# Patient Record
Sex: Female | Born: 1965
Health system: Southern US, Community
[De-identification: ages and names within clinical notes are randomized; demographics above are authoritative.]

## PROBLEM LIST (undated history)

## (undated) DIAGNOSIS — D649 Anemia, unspecified: Secondary | ICD-10-CM

## (undated) DIAGNOSIS — N6459 Other signs and symptoms in breast: Secondary | ICD-10-CM

## (undated) DIAGNOSIS — M1990 Unspecified osteoarthritis, unspecified site: Secondary | ICD-10-CM

## (undated) DIAGNOSIS — Z1211 Encounter for screening for malignant neoplasm of colon: Secondary | ICD-10-CM

## (undated) DIAGNOSIS — N63 Unspecified lump in unspecified breast: Secondary | ICD-10-CM

## (undated) DIAGNOSIS — R87629 Unspecified abnormal cytological findings in specimens from vagina: Secondary | ICD-10-CM

## (undated) DIAGNOSIS — Z780 Asymptomatic menopausal state: Secondary | ICD-10-CM

## (undated) DIAGNOSIS — D219 Benign neoplasm of connective and other soft tissue, unspecified: Secondary | ICD-10-CM

## (undated) HISTORY — DX: Benign neoplasm of connective and other soft tissue, unspecified: D21.9

## (undated) HISTORY — DX: Asymptomatic menopausal state: Z78.0

## (undated) HISTORY — DX: Unspecified osteoarthritis, unspecified site: M19.90

## (undated) HISTORY — DX: Unspecified abnormal cytological findings in specimens from vagina: R87.629

## (undated) HISTORY — PX: COLONOSCOPY: SHX174

## (undated) HISTORY — DX: Anemia, unspecified: D64.9

## (undated) HISTORY — DX: Other signs and symptoms in breast: N64.59

## (undated) HISTORY — DX: Encounter for screening for malignant neoplasm of colon: Z12.11

## (undated) HISTORY — DX: Unspecified lump in unspecified breast: N63.0

---

## 2002-07-09 HISTORY — PX: NOSE SURGERY: SHX723

## 2007-10-10 ENCOUNTER — Ambulatory Visit: Payer: Self-pay | Admitting: Internal Medicine

## 2009-07-09 DIAGNOSIS — N6459 Other signs and symptoms in breast: Secondary | ICD-10-CM

## 2009-07-09 HISTORY — DX: Other signs and symptoms in breast: N64.59

## 2010-07-09 DIAGNOSIS — N63 Unspecified lump in unspecified breast: Secondary | ICD-10-CM

## 2010-07-09 HISTORY — DX: Unspecified lump in unspecified breast: N63.0

## 2010-07-09 HISTORY — PX: BREAST BIOPSY: SHX20

## 2010-07-18 HISTORY — PX: BREAST SURGERY: SHX581

## 2010-11-22 ENCOUNTER — Emergency Department: Payer: Self-pay | Admitting: Emergency Medicine

## 2011-07-10 DIAGNOSIS — M1990 Unspecified osteoarthritis, unspecified site: Secondary | ICD-10-CM

## 2011-07-10 HISTORY — DX: Unspecified osteoarthritis, unspecified site: M19.90

## 2013-01-02 ENCOUNTER — Encounter: Payer: Self-pay | Admitting: *Deleted

## 2013-01-02 DIAGNOSIS — N63 Unspecified lump in unspecified breast: Secondary | ICD-10-CM | POA: Insufficient documentation

## 2013-06-25 ENCOUNTER — Ambulatory Visit: Payer: Self-pay | Admitting: General Surgery

## 2014-05-10 ENCOUNTER — Encounter: Payer: Self-pay | Admitting: *Deleted

## 2015-01-25 ENCOUNTER — Ambulatory Visit: Payer: Self-pay | Admitting: Podiatry

## 2015-07-12 ENCOUNTER — Other Ambulatory Visit: Payer: Self-pay | Admitting: Sports Medicine

## 2015-07-12 DIAGNOSIS — M545 Low back pain, unspecified: Secondary | ICD-10-CM

## 2015-07-16 ENCOUNTER — Other Ambulatory Visit: Payer: Self-pay

## 2015-09-12 ENCOUNTER — Encounter: Payer: Self-pay | Admitting: Gastroenterology

## 2015-09-12 ENCOUNTER — Other Ambulatory Visit: Payer: Self-pay

## 2015-09-12 DIAGNOSIS — Z1231 Encounter for screening mammogram for malignant neoplasm of breast: Secondary | ICD-10-CM

## 2015-09-13 ENCOUNTER — Encounter: Payer: Self-pay | Admitting: Gastroenterology

## 2015-09-22 ENCOUNTER — Ambulatory Visit: Payer: Self-pay

## 2015-10-25 ENCOUNTER — Encounter: Payer: Self-pay | Admitting: Medical

## 2015-11-07 ENCOUNTER — Encounter: Payer: Self-pay | Admitting: Gastroenterology

## 2015-11-18 ENCOUNTER — Encounter: Payer: Self-pay | Admitting: Gastroenterology

## 2016-03-07 ENCOUNTER — Ambulatory Visit
Admission: RE | Admit: 2016-03-07 | Discharge: 2016-03-07 | Disposition: A | Discharge status: Home / Self Care | Payer: 59 | Source: Ambulatory Visit

## 2016-03-07 DIAGNOSIS — Z1231 Encounter for screening mammogram for malignant neoplasm of breast: Secondary | ICD-10-CM

## 2016-08-09 ENCOUNTER — Ambulatory Visit: Payer: 59 | Admitting: Gynecologic Oncology

## 2016-08-29 ENCOUNTER — Other Ambulatory Visit (HOSPITAL_BASED_OUTPATIENT_CLINIC_OR_DEPARTMENT_OTHER): Payer: PRIVATE HEALTH INSURANCE

## 2016-08-29 ENCOUNTER — Encounter: Payer: Self-pay | Admitting: Gynecologic Oncology

## 2016-08-29 ENCOUNTER — Ambulatory Visit: Payer: No Typology Code available for payment source | Attending: Gynecologic Oncology | Admitting: Gynecologic Oncology

## 2016-08-29 VITALS — BP 116/88 | HR 78 | Temp 98.8°F | Resp 18 | Ht 61.0 in | Wt 143.0 lb

## 2016-08-29 DIAGNOSIS — Z9889 Other specified postprocedural states: Secondary | ICD-10-CM | POA: Insufficient documentation

## 2016-08-29 DIAGNOSIS — Z5189 Encounter for other specified aftercare: Secondary | ICD-10-CM | POA: Diagnosis not present

## 2016-08-29 DIAGNOSIS — N83202 Unspecified ovarian cyst, left side: Secondary | ICD-10-CM | POA: Insufficient documentation

## 2016-08-29 DIAGNOSIS — R971 Elevated cancer antigen 125 [CA 125]: Secondary | ICD-10-CM

## 2016-08-29 DIAGNOSIS — N9489 Other specified conditions associated with female genital organs and menstrual cycle: Secondary | ICD-10-CM | POA: Diagnosis not present

## 2016-08-29 DIAGNOSIS — N840 Polyp of corpus uteri: Secondary | ICD-10-CM | POA: Insufficient documentation

## 2016-08-29 DIAGNOSIS — N939 Abnormal uterine and vaginal bleeding, unspecified: Secondary | ICD-10-CM

## 2016-08-29 DIAGNOSIS — N924 Excessive bleeding in the premenopausal period: Secondary | ICD-10-CM | POA: Diagnosis not present

## 2016-08-29 LAB — BUN AND CREATININE (CC13)
BUN: 11.3 mg/dL (ref 7.0–26.0)
Creatinine: 0.7 mg/dL (ref 0.6–1.1)
EGFR: >90 mL/min/{1.73_m2} (ref >90)

## 2016-08-29 MED ORDER — IBUPROFEN 800 MG PO TABS: 800.0000 mg | ORAL_TABLET | Freq: Three times a day (TID) | ORAL | 0 refills | Status: AC | PRN

## 2016-08-29 MED ORDER — IBUPROFEN 800 MG PO TABS: 800.0000 mg | ORAL_TABLET | Freq: Three times a day (TID) | ORAL | 0 refills | Status: DC | PRN

## 2016-08-29 MED ORDER — IBUPROFEN 200 MG PO TABS
800.0000 mg | ORAL_TABLET | Freq: Once | ORAL | Status: AC
  Administered 2016-08-29: 800 mg via ORAL
  Filled 2016-08-29: qty 4

## 2016-08-29 NOTE — Progress Notes (Signed)
Consult Note: Gyn-Onc  Consult was requested by Dr. Radene Knee for the evaluation of Jean Gilbert 51 y.o. female  CC:  Chief Complaint  Patient presents with  . Endometrial mass    Assessment/Plan:  Ms. Jean Gilbert  is a 51 y.o.  year old with abnormal perimenopausal bleeding, an endometrial polyp, left ovarian cyst and elevated CA 125.  1/ follow up today's biopsy. If benign, I agree with Dr Radene Knee proceeding with hysteroscopy and polypectomy. 2/ order CT abdo/pelvis to evaluate for upper abdominal disease (eg metastatic endometrial or ovarian cancer). If absent, I have a low suspicion that the mild elevation is due to cancer (as the patient is menstruating and has a left ovarian cyst that is decreasing in size).  3/ If both imaging and biopsy are reassuring, she can proceed with planned procedure in March with Dr Radene Knee. If however cancer or concerning imaging findings are present we will proceed with surgery as appropriate.  HPI: The patient is a 51 year old G2 who is seen in consultation at the request of Dr Radene Knee for an endometrial mass, bleeding, and a left ovarian cyst with elevated CA 125.  The patient has a known history of fibroids. She has had menorrhagia for many years. In the past "few years" she has experienced intermenstrual bleeding. She was evaluated by Dr Radene Knee for this in October, 2017 and a TVUS was performed on 05/05/16 which showed a uterus measuring 8.6x6.5x6.7cm with an endometrial mass within the cavity. The largest fibroid measured 4cm. There was a left ovarian cyst measuring 4.3x4x4.5cm and another measuring 3.1x3.4x3.1cm with low level echoes within both cysts.    A repeat US was performed on12/6/17 and the endometrial polyp remained visible, but the cysts had decreased in size to 2.2 and 1.6cm repectively. They had "simple" appearance. CA 125 on that date was 60.  CA 125 was redrawn on 08/02/16 (prior to a planned hysteroscopic procedure on  09/13/16) and was again mildly elevated, but essentially unchanged at 41.  The patient has a history of "colitis" with bloating after eating certain foods. This is a life-long concern.  The patient has no family history for cancer. She has no major health problems. She has had 2 cesarean sections.  Current Meds:  No outpatient encounter prescriptions on file as of 08/29/2016.   No facility-administered encounter medications on file as of 08/29/2016.     Allergy: No Known Allergies  Social Hx:   Social History   Social History  . Marital status: Married    Spouse name: N/A  . Number of children: N/A  . Years of education: N/A   Occupational History  . Not on file.   Social History Main Topics  . Smoking status: Never Smoker  . Smokeless tobacco: Never Used  . Alcohol use No  . Drug use: No  . Sexual activity: Not on file   Other Topics Concern  . Not on file   Social History Narrative  . No narrative on file    Past Surgical Hx:  Past Surgical History:  Procedure Laterality Date  . BREAST SURGERY Right 1.10.2012   fibroadenoma  . CESAREAN SECTION  2000  . COLONOSCOPY     In her 63's in [place]  . NOSE SURGERY  2004    Past Medical Hx:  Past Medical History:  Diagnosis Date  . Arthritis 2013   left shoulder  . Breast complaint 2011  . Lump or mass in breast 2012  . Special screening for  malignant neoplasms, colon     Past Gynecological History:  C/s x 2 No LMP recorded.  Family Hx:  Family History  Problem Relation Age of Onset  . Cancer Maternal Uncle     kidney and prostate cancer    Review of Systems:  Constitutional  Feels well,    ENT Normal appearing ears and nares bilaterally Skin/Breast  No rash, sores, jaundice, itching, dryness Cardiovascular  No chest pain, shortness of breath, or edema  Pulmonary  No cough or wheeze.  Gastro Intestinal  No nausea, vomitting, or diarrhoea. No bright red blood per rectum, no abdominal pain, change  in bowel movement, or constipation.  Genito Urinary  No frequency, urgency, dysuria, + intermenstrual bleeding Musculo Skeletal  No myalgia, arthralgia, joint swelling or pain  Neurologic  No weakness, numbness, change in gait,  Psychology  No depression, anxiety, insomnia.   Vitals:  Blood pressure 116/88, pulse 78, temperature 98.8 F (37.1 C), temperature source Oral, resp. rate 18, height 5\' 1"  (1.549 m), weight 143 lb (64.9 kg), SpO2 100 %.  Physical Exam: WD in NAD Neck  Supple NROM, without any enlargements.  Lymph Node Survey No cervical supraclavicular or inguinal adenopathy Cardiovascular  Pulse normal rate, regularity and rhythm. S1 and S2 normal.  Lungs  Clear to auscultation bilateraly, without wheezes/crackles/rhonchi. Good air movement.  Skin  No rash/lesions/breakdown  Psychiatry  Alert and oriented to person, place, and time  Abdomen  Normoactive bowel sounds, abdomen soft, non-tender and nonobese without evidence of hernia.  Back No CVA tenderness Genito Urinary  Vulva/vagina: Normal external female genitalia.  No lesions. No discharge or bleeding.  Bladder/urethra:  No lesions or masses, well supported bladder  Vagina: normal  Cervix: Normal appearing, no lesions.  Uterus: Slightly enlarged, mobile, no parametrial involvement or nodularity.  Adnexa: no palpable masses. Rectal  deferred  Extremities  No bilateral cyanosis, clubbing or edema.   PROCEDURE NOTE: ENDOMETRIAL BIOPSY Verbal consent obtained Time out peformed Speculum placed, cervix visualized. Tenaculum placed anteriorally. Uterus sounded to 7cm with endometrial pipelle. One pass for moderate tissue.  Silver nitrate used on cervix. EBL < 5cc Complications none Specimens: endometrial biopsy to pathology   Donaciano Eva, MD  08/29/2016, 9:49 AM

## 2016-08-29 NOTE — Patient Instructions (Signed)
You will have blood work today. Your CT scan has been set up for 08/31/16 ay 1pm, at [facility], please follow the attached directions. Our office will contact you with the result.

## 2016-08-31 ENCOUNTER — Ambulatory Visit (HOSPITAL_COMMUNITY): Admission: RE | Admit: 2016-08-31 | Payer: PRIVATE HEALTH INSURANCE | Source: Ambulatory Visit

## 2016-09-03 ENCOUNTER — Telehealth: Payer: Self-pay | Admitting: Gynecologic Oncology

## 2016-09-03 NOTE — Telephone Encounter (Signed)
Called and left message to call back.  Recommend follow-up US imaging of ovarian cyst, and hysteroscopy with polypectomy with her gynecologist as scheduled.  Everitt Amber.

## 2016-09-06 ENCOUNTER — Telehealth: Payer: Self-pay | Admitting: Gynecologic Oncology

## 2016-09-06 NOTE — Telephone Encounter (Signed)
Informed patient of benign biopsy (endometrial) results: PROLIFERATIVE TYPE ENDOMETRIAL POLYP BENIGN ENDOCERVICAL TYPE GLANDS NO EVIDENCE OF ATYPIA OR MALIGNANCY  Patient had cancelled CT scan due to the cost and her "bad insurance".  She does not want to have the [employer] hysteroscopy procedure given that her bleeding has improved and she is concerned about the costs.  I have a low suspicion for ovarian cancer in this patient.  I am recommending repeat US with Dr Radene Knee in April, 2018 to monitor the simple cyst in the ovary. If it remains stable at that evaluation, she does not require additional surveillance imaging, however, this would be initiated if symptoms develop.  Donaciano Eva, MD

## 2017-04-12 ENCOUNTER — Other Ambulatory Visit: Payer: Self-pay | Admitting: Obstetrics and Gynecology

## 2017-04-12 DIAGNOSIS — Z1231 Encounter for screening mammogram for malignant neoplasm of breast: Secondary | ICD-10-CM

## 2017-04-15 ENCOUNTER — Encounter: Payer: Self-pay | Admitting: Radiology

## 2017-04-15 ENCOUNTER — Ambulatory Visit
Admission: RE | Admit: 2017-04-15 | Discharge: 2017-04-15 | Disposition: A | Discharge status: Home / Self Care | Payer: No Typology Code available for payment source | Source: Ambulatory Visit | Attending: Obstetrics and Gynecology | Admitting: Obstetrics and Gynecology

## 2017-04-15 DIAGNOSIS — Z1231 Encounter for screening mammogram for malignant neoplasm of breast: Secondary | ICD-10-CM

## 2017-07-15 ENCOUNTER — Ambulatory Visit: Payer: Self-pay | Admitting: Nurse Practitioner

## 2017-07-15 ENCOUNTER — Other Ambulatory Visit: Payer: Self-pay

## 2017-07-15 DIAGNOSIS — R4586 Emotional lability: Secondary | ICD-10-CM | POA: Insufficient documentation

## 2017-07-15 DIAGNOSIS — E785 Hyperlipidemia, unspecified: Secondary | ICD-10-CM | POA: Insufficient documentation

## 2017-07-15 DIAGNOSIS — G4700 Insomnia, unspecified: Secondary | ICD-10-CM | POA: Insufficient documentation

## 2017-07-15 DIAGNOSIS — N950 Postmenopausal bleeding: Secondary | ICD-10-CM | POA: Insufficient documentation

## 2017-07-15 DIAGNOSIS — J45991 Cough variant asthma: Secondary | ICD-10-CM | POA: Insufficient documentation

## 2017-07-15 DIAGNOSIS — F411 Generalized anxiety disorder: Secondary | ICD-10-CM | POA: Insufficient documentation

## 2017-07-15 DIAGNOSIS — E559 Vitamin D deficiency, unspecified: Secondary | ICD-10-CM | POA: Insufficient documentation

## 2017-07-15 DIAGNOSIS — N951 Menopausal and female climacteric states: Secondary | ICD-10-CM | POA: Insufficient documentation

## 2017-07-15 DIAGNOSIS — R499 Unspecified voice and resonance disorder: Secondary | ICD-10-CM | POA: Insufficient documentation

## 2017-07-16 ENCOUNTER — Other Ambulatory Visit: Payer: Self-pay | Admitting: Internal Medicine

## 2017-07-26 LAB — LIPID PANEL WITH LDL/HDL RATIO
Cholesterol, Total: 232 mg/dL — ABNORMAL HIGH (ref 100–199)
HDL: 40 mg/dL (ref >39)
LDL CALC: 114 mg/dL — AB (ref 0–99)
LDl/HDL Ratio: 2.9 ratio (ref 0.0–3.2)
TRIGLYCERIDES: 391 mg/dL — AB (ref 0–149)
VLDL Cholesterol Cal: 78 mg/dL — ABNORMAL HIGH (ref 5–40)

## 2017-07-26 LAB — COMPREHENSIVE METABOLIC PANEL
ALK PHOS: 92 IU/L (ref 39–117)
ALT: 14 IU/L (ref 0–32)
AST: 16 IU/L (ref 0–40)
Albumin/Globulin Ratio: 1.6 (ref 1.2–2.2)
Albumin: 4.4 g/dL (ref 3.5–5.5)
BUN/Creatinine Ratio: 17 (ref 9–23)
BUN: 11 mg/dL (ref 6–24)
Bilirubin Total: <0.2 mg/dL (ref 0.0–1.2)
CALCIUM: 9.1 mg/dL (ref 8.7–10.2)
CO2: 22 mmol/L (ref 20–29)
CREATININE: 0.63 mg/dL (ref 0.57–1.00)
Chloride: 102 mmol/L (ref 96–106)
GFR calc Af Amer: 118 mL/min/{1.73_m2} (ref >59)
GFR, EST NON AFRICAN AMERICAN: 103 mL/min/{1.73_m2} (ref >59)
GLOBULIN, TOTAL: 2.7 g/dL (ref 1.5–4.5)
Glucose: 85 mg/dL (ref 65–99)
POTASSIUM: 4.8 mmol/L (ref 3.5–5.2)
SODIUM: 139 mmol/L (ref 134–144)
Total Protein: 7.1 g/dL (ref 6.0–8.5)

## 2017-07-26 LAB — VITAMIN D 25 HYDROXY (VIT D DEFICIENCY, FRACTURES): Vit D, 25-Hydroxy: 25.1 ng/mL — ABNORMAL LOW (ref 30.0–100.0)

## 2017-07-30 ENCOUNTER — Telehealth: Payer: Self-pay

## 2017-07-30 NOTE — Telephone Encounter (Signed)
Pt advised for labs chol is still high but better than last labs follow prudent diet and make appt to discuss in detail as per dfk/np

## 2017-08-06 NOTE — Progress Notes (Signed)
Can u tell me when is her f/u

## 2018-09-09 ENCOUNTER — Ambulatory Visit: Payer: Self-pay | Admitting: Nurse Practitioner

## 2018-09-15 ENCOUNTER — Encounter: Payer: Self-pay | Admitting: Nurse Practitioner

## 2018-09-15 ENCOUNTER — Ambulatory Visit: Payer: BLUE CROSS/BLUE SHIELD | Admitting: Adult Health

## 2018-09-15 ENCOUNTER — Other Ambulatory Visit: Payer: Self-pay | Admitting: Adult Health

## 2018-09-15 VITALS — BP 113/78 | HR 73 | Resp 16 | Ht 63.0 in | Wt 145.9 lb

## 2018-09-15 DIAGNOSIS — E785 Hyperlipidemia, unspecified: Secondary | ICD-10-CM

## 2018-09-15 DIAGNOSIS — F411 Generalized anxiety disorder: Secondary | ICD-10-CM

## 2018-09-15 NOTE — Progress Notes (Signed)
[facility] [place], [place] [address]  Internal MEDICINE  Office Visit Note  Patient Name: Jean Gilbert  [id number]  [id number]  Date of Service: 09/15/2018  Chief Complaint  Patient presents with  . Follow-up    HPI  Pt is here to follow up on her cholesterol.  She repots over the past year she has been doing well.  She changed her diet to vegetarian diet, and believes her cholesterol should be much improved.  She denies taking any medications at this time.    Current Medication: Outpatient Encounter Medications as of 09/15/2018  Medication Sig  . Cholecalciferol (VITAMIN D3) 1000 units CAPS Take by mouth every other day.  . ibuprofen (ADVIL,MOTRIN) 800 MG tablet Take 1 tablet (800 mg total) by mouth every 8 (eight) hours as needed. (Patient not taking: Reported on 09/15/2018)  . rosuvastatin (CRESTOR) 10 MG tablet Take 10 mg by mouth daily.   No facility-administered encounter medications on file as of 09/15/2018.     Surgical History: Past Surgical History:  Procedure Laterality Date  . BREAST SURGERY Right 1.10.2012   fibroadenoma  . CESAREAN SECTION  2000  . COLONOSCOPY     In her 37's in [place]  . NOSE SURGERY  2004    Medical History: Past Medical History:  Diagnosis Date  . Arthritis 2013   left shoulder  . Breast complaint 2011  . Lump or mass in breast 2012  . Special screening for malignant neoplasms, colon     Family History: Family History  Problem Relation Age of Onset  . Cancer Maternal Uncle        kidney and prostate cancer  . Breast cancer Maternal Aunt   . Heart disease Mother   . Diabetes Mother   . Hyperlipidemia Mother   . Asthma Maternal Grandmother     Social History   Socioeconomic History  . Marital status: Married    Spouse name: Not on file  . Number of children: Not on file  . Years of education: Not on file  . Highest education level: Not on file  Occupational History  . Not on file   Social Needs  . Financial resource strain: Not on file  . Food insecurity:    Worry: Not on file    Inability: Not on file  . Transportation needs:    Medical: Not on file    Non-medical: Not on file  Tobacco Use  . Smoking status: Never Smoker  . Smokeless tobacco: Never Used  Substance and Sexual Activity  . Alcohol use: No  . Drug use: No  . Sexual activity: Not on file  Lifestyle  . Physical activity:    Days per week: Not on file    Minutes per session: Not on file  . Stress: Not on file  Relationships  . Social connections:    Talks on phone: Not on file    Gets together: Not on file    Attends religious service: Not on file    Active member of club or organization: Not on file    Attends meetings of clubs or organizations: Not on file    Relationship status: Not on file  . Intimate partner violence:    Fear of current or ex partner: Not on file    Emotionally abused: Not on file    Physically abused: Not on file    Forced sexual activity: Not on file  Other Topics Concern  . Not on file  Social History Narrative  . Not on file      Review of Systems  Constitutional: Negative for chills, fatigue and unexpected weight change.  HENT: Negative for congestion, rhinorrhea, sneezing and sore throat.   Eyes: Negative for photophobia, pain and redness.  Respiratory: Negative for cough, chest tightness and shortness of breath.   Cardiovascular: Negative for chest pain and palpitations.  Gastrointestinal: Negative for abdominal pain, constipation, diarrhea, nausea and vomiting.  Endocrine: Negative.   Genitourinary: Negative for dysuria and frequency.  Musculoskeletal: Negative for arthralgias, back pain, joint swelling and neck pain.  Skin: Negative for rash.  Allergic/Immunologic: Negative.   Neurological: Negative for tremors and numbness.  Hematological: Negative for adenopathy. Does not bruise/bleed easily.  Psychiatric/Behavioral: Negative for behavioral  problems and sleep disturbance. The patient is not nervous/anxious.     Vital Signs: BP 113/78   Pulse 73   Resp 16   Ht 5\' 3"  (1.6 m)   Wt 145 lb 14.4 oz (66.2 kg)   SpO2 98%   BMI 25.85 kg/m    Physical Exam Vitals signs and nursing note reviewed.  Constitutional:      General: She is not in acute distress.    Appearance: She is well-developed. She is not diaphoretic.  HENT:     Head: Normocephalic and atraumatic.     Mouth/Throat:     Pharynx: No oropharyngeal exudate.  Eyes:     Pupils: Pupils are equal, round, and reactive to light.  Neck:     Musculoskeletal: Normal range of motion and neck supple.     Thyroid: No thyromegaly.     Vascular: No JVD.     Trachea: No tracheal deviation.  Cardiovascular:     Rate and Rhythm: Normal rate and regular rhythm.     Heart sounds: Normal heart sounds. No murmur. No friction rub. No gallop.   Pulmonary:     Effort: Pulmonary effort is normal. No respiratory distress.     Breath sounds: Normal breath sounds. No wheezing or rales.  Chest:     Chest wall: No tenderness.  Abdominal:     Palpations: Abdomen is soft.     Tenderness: There is no abdominal tenderness. There is no guarding.  Musculoskeletal: Normal range of motion.  Lymphadenopathy:     Cervical: No cervical adenopathy.  Skin:    General: Skin is warm and dry.  Neurological:     Mental Status: She is alert and oriented to person, place, and time.     Cranial Nerves: No cranial nerve deficit.  Psychiatric:        Behavior: Behavior normal.        Thought Content: Thought content normal.        Judgment: Judgment normal.    Assessment/Plan: 1. Hyperlipidemia, unspecified hyperlipidemia type Given lab slip for lipid panel.  We will follow-up with patient when results are available.  2. Generalized anxiety disorder Stable, continue current therapy as prescribed.  General Counseling: keelee yankey understanding of the findings of todays visit and  agrees with plan of treatment. I have discussed any further diagnostic evaluation that may be needed or ordered today. We also reviewed her medications today. she has been encouraged to call the office with any questions or concerns that should arise related to todays visit.    No orders of the defined types were placed in this encounter.   No orders of the defined types were placed in this encounter.   Time spent: 25 Minutes  This patient was seen by Orson Gear AGNP-C in Collaboration with Dr Lavera Guise as a part of collaborative care agreement     Kendell Bane AGNP-C Internal medicine

## 2018-09-16 LAB — CBC WITH DIFFERENTIAL/PLATELET
BASOS ABS: 0 10*3/uL (ref 0.0–0.2)
Basos: 0 %
EOS (ABSOLUTE): 0.1 10*3/uL (ref 0.0–0.4)
Eos: 2 %
Hematocrit: 37.1 % (ref 34.0–46.6)
Hemoglobin: 12.3 g/dL (ref 11.1–15.9)
IMMATURE GRANS (ABS): 0 10*3/uL (ref 0.0–0.1)
Immature Granulocytes: 0 %
LYMPHS ABS: 3 10*3/uL (ref 0.7–3.1)
LYMPHS: 45 %
MCH: 28.4 pg (ref 26.6–33.0)
MCHC: 33.2 g/dL (ref 31.5–35.7)
MCV: 86 fL (ref 79–97)
MONOS ABS: 0.6 10*3/uL (ref 0.1–0.9)
Monocytes: 9 %
NEUTROS ABS: 2.9 10*3/uL (ref 1.4–7.0)
Neutrophils: 44 %
PLATELETS: 264 10*3/uL (ref 150–450)
RBC: 4.33 x10E6/uL (ref 3.77–5.28)
RDW: 12.9 % (ref 11.7–15.4)
WBC: 6.6 10*3/uL (ref 3.4–10.8)

## 2018-09-16 LAB — COMPREHENSIVE METABOLIC PANEL
ALT: 31 IU/L (ref 0–32)
AST: 24 IU/L (ref 0–40)
Albumin/Globulin Ratio: 1.7 (ref 1.2–2.2)
Albumin: 4.5 g/dL (ref 3.8–4.9)
Alkaline Phosphatase: 90 IU/L (ref 39–117)
BUN/Creatinine Ratio: 19 (ref 9–23)
BUN: 11 mg/dL (ref 6–24)
Bilirubin Total: <0.2 mg/dL (ref 0.0–1.2)
CHLORIDE: 101 mmol/L (ref 96–106)
CO2: 21 mmol/L (ref 20–29)
Calcium: 9.4 mg/dL (ref 8.7–10.2)
Creatinine, Ser: 0.57 mg/dL (ref 0.57–1.00)
GFR, EST AFRICAN AMERICAN: 121 mL/min/{1.73_m2} (ref >59)
GFR, EST NON AFRICAN AMERICAN: 105 mL/min/{1.73_m2} (ref >59)
Globulin, Total: 2.7 g/dL (ref 1.5–4.5)
Glucose: 80 mg/dL (ref 65–99)
POTASSIUM: 4.4 mmol/L (ref 3.5–5.2)
Sodium: 140 mmol/L (ref 134–144)
TOTAL PROTEIN: 7.2 g/dL (ref 6.0–8.5)

## 2018-09-16 LAB — B12 AND FOLATE PANEL
Folate: 18.4 ng/mL (ref >3.0)
VITAMIN B 12: 288 pg/mL (ref 232–1245)

## 2018-09-16 LAB — T4, FREE: FREE T4: 1 ng/dL (ref 0.82–1.77)

## 2018-09-16 LAB — LIPID PANEL WITH LDL/HDL RATIO
Cholesterol, Total: 252 mg/dL — ABNORMAL HIGH (ref 100–199)
HDL: 41 mg/dL (ref >39)
LDL Calculated: 141 mg/dL — ABNORMAL HIGH (ref 0–99)
LDL/HDL RATIO: 3.4 ratio — AB (ref 0.0–3.2)
TRIGLYCERIDES: 351 mg/dL — AB (ref 0–149)
VLDL CHOLESTEROL CAL: 70 mg/dL — AB (ref 5–40)

## 2018-09-16 LAB — VITAMIN D 25 HYDROXY (VIT D DEFICIENCY, FRACTURES): VIT D 25 HYDROXY: 27.1 ng/mL — AB (ref 30.0–100.0)

## 2018-09-16 LAB — IRON AND TIBC
IRON SATURATION: 15 % (ref 15–55)
Iron: 55 ug/dL (ref 27–159)
TIBC: 379 ug/dL (ref 250–450)
UIBC: 324 ug/dL (ref 131–425)

## 2018-09-16 LAB — TSH: TSH: 2.67 u[IU]/mL (ref 0.450–4.500)

## 2018-09-16 LAB — FERRITIN: Ferritin: 29 ng/mL (ref 15–150)

## 2018-10-09 ENCOUNTER — Encounter: Payer: Self-pay | Admitting: Internal Medicine

## 2018-10-09 ENCOUNTER — Other Ambulatory Visit: Payer: Self-pay | Admitting: Nurse Practitioner

## 2018-10-09 ENCOUNTER — Ambulatory Visit (INDEPENDENT_AMBULATORY_CARE_PROVIDER_SITE_OTHER): Payer: BLUE CROSS/BLUE SHIELD | Admitting: Internal Medicine

## 2018-10-09 ENCOUNTER — Other Ambulatory Visit: Payer: Self-pay

## 2018-10-09 DIAGNOSIS — E785 Hyperlipidemia, unspecified: Secondary | ICD-10-CM | POA: Diagnosis not present

## 2018-10-09 DIAGNOSIS — R040 Epistaxis: Secondary | ICD-10-CM | POA: Diagnosis not present

## 2018-10-09 MED ORDER — ROSUVASTATIN CALCIUM 5 MG PO TABS: ORAL_TABLET | ORAL | 1 refills | Status: DC

## 2018-10-09 NOTE — Progress Notes (Signed)
[facility] [place], [place] [address]  Internal MEDICINE  Office Visit Note  Patient Name: Jean Gilbert  [id number]  [id number]  Date of Service: 10/14/2018  Chief Complaint  Patient presents with  . Arthritis  . Follow-up    labs   . Cough  . Allergies    HPI She is here for Labs. Her lipid profile is worse than before. Pt did change her diet and excluded all meat from her diet but her numbers are worse, she does have very strong FHX of CAD. Pt is also c.o excessive nose bleeds and will like to see ENT   Current Medication: Outpatient Encounter Medications as of 10/09/2018  Medication Sig  . Cholecalciferol (VITAMIN D3) 1000 units CAPS Take by mouth every other day.  . ibuprofen (ADVIL,MOTRIN) 800 MG tablet Take 1 tablet (800 mg total) by mouth every 8 (eight) hours as needed. (Patient not taking: Reported on 09/15/2018)  . rosuvastatin (CRESTOR) 5 MG tablet Take one tab po qod  . [DISCONTINUED] rosuvastatin (CRESTOR) 10 MG tablet Take 10 mg by mouth daily.   No facility-administered encounter medications on file as of 10/09/2018.     Surgical History: Past Surgical History:  Procedure Laterality Date  . BREAST SURGERY Right 1.10.2012   fibroadenoma  . CESAREAN SECTION  2000  . COLONOSCOPY     In her 65's in [place]  . NOSE SURGERY  2004    Medical History: Past Medical History:  Diagnosis Date  . Arthritis 2013   left shoulder  . Breast complaint 2011  . Lump or mass in breast 2012  . Special screening for malignant neoplasms, colon     Family History: Family History  Problem Relation Age of Onset  . Cancer Maternal Uncle        kidney and prostate cancer  . Breast cancer Maternal Aunt   . Heart disease Mother   . Diabetes Mother   . Hyperlipidemia Mother   . Asthma Maternal Grandmother     Social History   Socioeconomic History  . Marital status: Married    Spouse name: Not on file  . Number of children: Not on file   . Years of education: Not on file  . Highest education level: Not on file  Occupational History  . Not on file  Social Needs  . Financial resource strain: Not on file  . Food insecurity:    Worry: Not on file    Inability: Not on file  . Transportation needs:    Medical: Not on file    Non-medical: Not on file  Tobacco Use  . Smoking status: Never Smoker  . Smokeless tobacco: Never Used  Substance and Sexual Activity  . Alcohol use: No  . Drug use: No  . Sexual activity: Not on file  Lifestyle  . Physical activity:    Days per week: Not on file    Minutes per session: Not on file  . Stress: Not on file  Relationships  . Social connections:    Talks on phone: Not on file    Gets together: Not on file    Attends religious service: Not on file    Active member of club or organization: Not on file    Attends meetings of clubs or organizations: Not on file    Relationship status: Not on file  . Intimate partner violence:    Fear of current or ex partner: Not on file    Emotionally abused:  Not on file    Physically abused: Not on file    Forced sexual activity: Not on file  Other Topics Concern  . Not on file  Social History Narrative  . Not on file      Review of Systems  Constitutional: Negative.   Respiratory: Negative.   Gastrointestinal: Negative.   Musculoskeletal: Negative.     Vital Signs: BP 120/79   Pulse (!) 52   Temp 97.6 F (36.4 C)   Resp 16   Ht 5\' 2"  (1.575 m)   Wt 147 lb (66.7 kg)   SpO2 100%   BMI 26.89 kg/m    Physical Exam Cardiovascular:     Rate and Rhythm: Normal rate and regular rhythm.  Pulmonary:     Effort: Pulmonary effort is normal.     Breath sounds: Normal breath sounds.    Assessment/Plan: 1. Bleeding from the nose - Ambulatory referral to ENT  2. Hyperlipidemia, unspecified hyperlipidemia type - Start rosuvastatin (CRESTOR) 5 MG tablet; Take one tab po qod  Dispense: 90 tablet; Refill: 1  General Counseling:  Erielle verbalizes understanding of the findings of todays visit and agrees with plan of treatment. I have discussed any further diagnostic evaluation that may be needed or ordered today. We also reviewed her medications today. she has been encouraged to call the office with any questions or concerns that should arise related to todays visit. Cardiac risk factor modification:  1. Control blood pressure. 2. Exercise as prescribed. 3. Follow low sodium, low fat diet. and low fat and low cholestrol diet. 4. Take ASA 81mg  once a day. 5. Restricted calories diet to lose weight.   Orders Placed This Encounter  Procedures  . Ambulatory referral to ENT    Meds ordered this encounter  Medications  . rosuvastatin (CRESTOR) 5 MG tablet    Sig: Take one tab po qod    Dispense:  90 tablet    Refill:  1    Time spent:20Minutes   Dr Lavera Guise Internal medicine

## 2018-11-06 ENCOUNTER — Other Ambulatory Visit: Payer: Self-pay

## 2018-11-06 ENCOUNTER — Encounter: Payer: Self-pay | Admitting: Nurse Practitioner

## 2018-11-06 ENCOUNTER — Ambulatory Visit (INDEPENDENT_AMBULATORY_CARE_PROVIDER_SITE_OTHER): Payer: BLUE CROSS/BLUE SHIELD | Admitting: Nurse Practitioner

## 2018-11-06 VITALS — BP 120/80 | HR 70 | Resp 16 | Ht 62.0 in | Wt 142.2 lb

## 2018-11-06 DIAGNOSIS — Z1239 Encounter for other screening for malignant neoplasm of breast: Secondary | ICD-10-CM

## 2018-11-06 DIAGNOSIS — Z124 Encounter for screening for malignant neoplasm of cervix: Secondary | ICD-10-CM | POA: Diagnosis not present

## 2018-11-06 DIAGNOSIS — E785 Hyperlipidemia, unspecified: Secondary | ICD-10-CM

## 2018-11-06 DIAGNOSIS — Z0001 Encounter for general adult medical examination with abnormal findings: Secondary | ICD-10-CM

## 2018-11-06 DIAGNOSIS — D259 Leiomyoma of uterus, unspecified: Secondary | ICD-10-CM

## 2018-11-06 DIAGNOSIS — H7291 Unspecified perforation of tympanic membrane, right ear: Secondary | ICD-10-CM | POA: Insufficient documentation

## 2018-11-06 DIAGNOSIS — R300 Dysuria: Secondary | ICD-10-CM

## 2018-11-06 MED ORDER — ROSUVASTATIN CALCIUM 5 MG PO TABS: ORAL_TABLET | ORAL | 3 refills | Status: DC

## 2018-11-06 NOTE — Progress Notes (Signed)
[facility] [place], [place] [address]  Internal MEDICINE  Office Visit Note  Patient Name: Jean Gilbert  [id number]  [id number]  Date of Service: 11/23/2018   Pt is here for routine health maintenance examination  Chief Complaint  Patient presents with  . Annual Exam    pt have some questions about blood pressure, pt believes she possibly have ADHD  . Gynecologic Exam  . Arthritis  . Depression  . Quality Metric Gaps    mammogram, pt wants to know about sonography     The patient is here for health maintenance exam with pap smear. She continues to have lower abdominal/pelvic pain. She is scheduled to have transvaginal ultrasound in May for further evaluation. She has had labs done since she was last seen which show her cholesterol is moderately elevated. Will start crestor 5mg  every evening and recheck levels in a few months. She is due to have screening mammogram.     Current Medication: Outpatient Encounter Medications as of 11/06/2018  Medication Sig  . Cholecalciferol (VITAMIN D3) 1000 units CAPS Take by mouth every other day.  . ibuprofen (ADVIL,MOTRIN) 800 MG tablet Take 1 tablet (800 mg total) by mouth every 8 (eight) hours as needed. (Patient not taking: Reported on 09/15/2018)  . rosuvastatin (CRESTOR) 5 MG tablet Take one tab po qod  . [DISCONTINUED] rosuvastatin (CRESTOR) 5 MG tablet Take one tab po qod (Patient not taking: Reported on 11/06/2018)   No facility-administered encounter medications on file as of 11/06/2018.     Surgical History: Past Surgical History:  Procedure Laterality Date  . BREAST SURGERY Right 1.10.2012   fibroadenoma  . CESAREAN SECTION  2000  . COLONOSCOPY     In her 59's in [place]  . NOSE SURGERY  2004    Medical History: Past Medical History:  Diagnosis Date  . Arthritis 2013   left shoulder  . Breast complaint 2011  . Lump or mass in breast 2012  . Menopause   . Special screening for  malignant neoplasms, colon     Family History: Family History  Problem Relation Age of Onset  . Cancer Maternal Uncle        kidney and prostate cancer  . Breast cancer Maternal Aunt   . Heart disease Mother   . Diabetes Mother   . Hyperlipidemia Mother   . Asthma Maternal Grandmother       Review of Systems  Constitutional: Negative for chills, fatigue and unexpected weight change.  HENT: Negative for congestion, rhinorrhea, sneezing and sore throat.   Respiratory: Negative for cough, chest tightness and shortness of breath.   Cardiovascular: Negative for chest pain and palpitations.  Gastrointestinal: Negative for abdominal pain, constipation, diarrhea, nausea and vomiting.  Endocrine: Negative for cold intolerance, heat intolerance, polydipsia and polyuria.  Genitourinary: Positive for pelvic pain. Negative for dysuria and frequency.  Musculoskeletal: Negative for arthralgias, back pain, joint swelling and neck pain.  Skin: Negative for rash.  Allergic/Immunologic: Negative for environmental allergies.  Neurological: Negative for dizziness, tremors, numbness and headaches.  Hematological: Negative for adenopathy. Does not bruise/bleed easily.  Psychiatric/Behavioral: Negative for behavioral problems and sleep disturbance. The patient is not nervous/anxious.      Today's Vitals   11/06/18 1100  BP: 120/80  Pulse: 70  Resp: 16  SpO2: 99%  Weight: 142 lb 3.2 oz (64.5 kg)  Height: 5\' 2"  (1.575 m)   Body mass index is 26.01 kg/m.  Physical Exam Vitals signs and  nursing note reviewed.  Constitutional:      General: She is not in acute distress.    Appearance: Normal appearance. She is well-developed. She is not diaphoretic.  HENT:     Head: Normocephalic and atraumatic.     Mouth/Throat:     Pharynx: No oropharyngeal exudate.  Eyes:     Pupils: Pupils are equal, round, and reactive to light.  Neck:     Musculoskeletal: Normal range of motion and neck supple.      Thyroid: No thyromegaly.     Vascular: No carotid bruit or JVD.     Trachea: No tracheal deviation.  Cardiovascular:     Rate and Rhythm: Normal rate and regular rhythm.     Pulses: Normal pulses.     Heart sounds: Normal heart sounds. No murmur. No friction rub. No gallop.   Pulmonary:     Effort: Pulmonary effort is normal. No respiratory distress.     Breath sounds: Normal breath sounds. No wheezing or rales.  Chest:     Chest wall: No tenderness.     Breasts:        Right: Normal. No swelling, bleeding, inverted nipple, mass, nipple discharge, skin change or tenderness.        Left: Normal. No swelling, bleeding, inverted nipple, mass, nipple discharge, skin change or tenderness.  Abdominal:     General: Bowel sounds are normal.     Palpations: Abdomen is soft.     Tenderness: There is no abdominal tenderness. There is no guarding.  Genitourinary:    General: Normal vulva.     Exam position: Supine.     Labia:        Right: No rash or tenderness.        Left: No rash or tenderness.      Vagina: Normal. No vaginal discharge, erythema, tenderness or bleeding.     Cervix: No cervical motion tenderness, discharge or friability.     Uterus: Enlarged.      Adnexa: Right adnexa normal and left adnexa normal.     Comments: There is mild tenderness during bimanual exam. there are no masses, or organomeglay present during bimanual exam . Musculoskeletal: Normal range of motion.  Lymphadenopathy:     Cervical: No cervical adenopathy.  Skin:    General: Skin is warm and dry.  Neurological:     Mental Status: She is alert and oriented to person, place, and time.     Cranial Nerves: No cranial nerve deficit.  Psychiatric:        Behavior: Behavior normal.        Thought Content: Thought content normal.        Judgment: Judgment normal.      LABS: Recent Results (from the past 2160 hour(s))  CBC with Differential/Platelet     Status: None   Collection Time: 09/15/18  1:42 PM   Result Value Ref Range   WBC 6.6 3.4 - 10.8 x10E3/uL   RBC 4.33 3.77 - 5.28 x10E6/uL   Hemoglobin 12.3 11.1 - 15.9 g/dL   Hematocrit 37.1 34.0 - 46.6 %   MCV 86 79 - 97 fL   MCH 28.4 26.6 - 33.0 pg   MCHC 33.2 31.5 - 35.7 g/dL   RDW 12.9 11.7 - 15.4 %   Platelets 264 150 - 450 x10E3/uL   Neutrophils 44 Not Estab. %   Lymphs 45 Not Estab. %   Monocytes 9 Not Estab. %   Eos 2 Not Estab. %  Basos 0 Not Estab. %   Neutrophils Absolute 2.9 1.4 - 7.0 x10E3/uL   Lymphocytes Absolute 3.0 0.7 - 3.1 x10E3/uL   Monocytes Absolute 0.6 0.1 - 0.9 x10E3/uL   EOS (ABSOLUTE) 0.1 0.0 - 0.4 x10E3/uL   Basophils Absolute 0.0 0.0 - 0.2 x10E3/uL   Immature Granulocytes 0 Not Estab. %   Immature Grans (Abs) 0.0 0.0 - 0.1 x10E3/uL  Comprehensive metabolic panel     Status: None   Collection Time: 09/15/18  1:42 PM  Result Value Ref Range   Glucose 80 65 - 99 mg/dL   BUN 11 6 - 24 mg/dL   Creatinine, Ser 0.57 0.57 - 1.00 mg/dL   GFR calc non Af Amer 105 >59 mL/min/1.73   GFR calc Af Amer 121 >59 mL/min/1.73   BUN/Creatinine Ratio 19 9 - 23   Sodium 140 134 - 144 mmol/L   Potassium 4.4 3.5 - 5.2 mmol/L   Chloride 101 96 - 106 mmol/L   CO2 21 20 - 29 mmol/L   Calcium 9.4 8.7 - 10.2 mg/dL   Total Protein 7.2 6.0 - 8.5 g/dL   Albumin 4.5 3.8 - 4.9 g/dL   Globulin, Total 2.7 1.5 - 4.5 g/dL   Albumin/Globulin Ratio 1.7 1.2 - 2.2   Bilirubin Total <0.2 0.0 - 1.2 mg/dL   Alkaline Phosphatase 90 39 - 117 IU/L   AST 24 0 - 40 IU/L   ALT 31 0 - 32 IU/L  Lipid Panel With LDL/HDL Ratio     Status: Abnormal   Collection Time: 09/15/18  1:42 PM  Result Value Ref Range   Cholesterol, Total 252 (H) 100 - 199 mg/dL   Triglycerides 351 (H) 0 - 149 mg/dL   HDL 41 >39 mg/dL   VLDL Cholesterol Cal 70 (H) 5 - 40 mg/dL   LDL Calculated 141 (H) 0 - 99 mg/dL   LDl/HDL Ratio 3.4 (H) 0.0 - 3.2 ratio    Comment:                                     LDL/HDL Ratio                                             Men   Women                               1/2 Avg.Risk  1.0    1.5                                   Avg.Risk  3.6    3.2                                2X Avg.Risk  6.2    5.0                                3X Avg.Risk  8.0    6.1   Iron and TIBC     Status: None   Collection Time: 09/15/18  1:42 PM  Result  Value Ref Range   Total Iron Binding Capacity 379 250 - 450 ug/dL   UIBC 324 131 - 425 ug/dL   Iron 55 27 - 159 ug/dL   Iron Saturation 15 15 - 55 %  B12 and Folate Panel     Status: None   Collection Time: 09/15/18  1:42 PM  Result Value Ref Range   Vitamin B-12 288 232 - 1,245 pg/mL   Folate 18.4 >3.0 ng/mL    Comment: A serum folate concentration of less than 3.1 ng/mL is considered to represent clinical deficiency.   T4, free     Status: None   Collection Time: 09/15/18  1:42 PM  Result Value Ref Range   Free T4 1.00 0.82 - 1.77 ng/dL  TSH     Status: None   Collection Time: 09/15/18  1:42 PM  Result Value Ref Range   TSH 2.670 0.450 - 4.500 uIU/mL  VITAMIN D 25 Hydroxy (Vit-D Deficiency, Fractures)     Status: Abnormal   Collection Time: 09/15/18  1:42 PM  Result Value Ref Range   Vit D, 25-Hydroxy 27.1 (L) 30.0 - 100.0 ng/mL    Comment: Vitamin D deficiency has been defined by the [place] and an Endocrine Society practice guideline as a level of serum 25-OH vitamin D less than 20 ng/mL (1,2). The Endocrine Society went on to further define vitamin D insufficiency as a level between 21 and 29 ng/mL (2). 1. IOM (Institute of Medicine). 2010. Dietary reference    intakes for calcium and D. [place]: The    [employer]. 2. Holick MF, Binkley Hollister, Bischoff-Ferrari HA, et al.    Evaluation, treatment, and prevention of vitamin D    deficiency: an Endocrine Society clinical practice    guideline. JCEM. 2011 Jul; 96(7):1911-30.   Ferritin     Status: None   Collection Time: 09/15/18  1:42 PM  Result Value Ref Range   Ferritin 29 15 - 150  ng/mL  UA/M w/rflx Culture, Routine     Status: Abnormal   Collection Time: 11/06/18 11:30 AM  Result Value Ref Range   Specific Gravity, UA 1.024 1.005 - 1.030   pH, UA 5.0 5.0 - 7.5   Color, UA Yellow Yellow   Appearance Ur Clear Clear   Leukocytes,UA 2+ (A) Negative   Protein,UA Negative Negative/Trace   Glucose, UA Negative Negative   Ketones, UA Negative Negative   RBC, UA Negative Negative   Bilirubin, UA Negative Negative   Urobilinogen, Ur 0.2 0.2 - 1.0 mg/dL   Nitrite, UA Negative Negative   Microscopic Examination See below:     Comment: Microscopic was indicated and was performed.   Urinalysis Reflex Comment     Comment: This specimen has reflexed to a Urine Culture.  Microscopic Examination     Status: Abnormal   Collection Time: 11/06/18 11:30 AM  Result Value Ref Range   WBC, UA 6-10 (A) 0 - 5 /hpf   RBC 0-2 0 - 2 /hpf   Epithelial Cells (non renal) 0-10 0 - 10 /hpf   Casts None seen None seen /lpf   Crystals Present (A) N/A   Crystal Type Calcium Oxalate N/A   Mucus, UA Present Not Estab.   Bacteria, UA Few None seen/Few   Yeast, UA Present (A) None seen  Urine Culture, Reflex     Status: None   Collection Time: 11/06/18 11:30 AM  Result Value Ref Range   Urine Culture, Routine Final  report    Organism ID, Bacteria Comment     Comment: Mixed urogenital flora Greater than 100,000 colony forming units per mL    Assessment/Plan: 1. Encounter for general adult medical examination with abnormal findings Annual health maintenance exam today with pap smear .  2. Uterine leiomyoma, unspecified location Scheduled for transvaginal pelvic ultrasound in May for further evaluation.   3. Hyperlipidemia, unspecified hyperlipidemia type Start crestor 5mg  daily. Recheck lipid panel in few months for further evaluation.  - rosuvastatin (CRESTOR) 5 MG tablet; Take one tab po qod  Dispense: 90 tablet; Refill: 3  4. Routine cervical smear Pap smear today.   5.  Screening for breast cancer - MM DIGITAL SCREENING BILATERAL; Future  6. Dysuria - UA/M w/rflx Culture, Routine  General Counseling: Artrice verbalizes understanding of the findings of todays visit and agrees with plan of treatment. I have discussed any further diagnostic evaluation that may be needed or ordered today. We also reviewed her medications today. she has been encouraged to call the office with any questions or concerns that should arise related to todays visit.    Counseling:  This patient was seen by Leretha Pol FNP Collaboration with Dr Lavera Guise as a part of collaborative care agreement  Orders Placed This Encounter  Procedures  . Microscopic Examination  . Urine Culture, Reflex  . MM DIGITAL SCREENING BILATERAL  . UA/M w/rflx Culture, Routine    Meds ordered this encounter  Medications  . rosuvastatin (CRESTOR) 5 MG tablet    Sig: Take one tab po qod    Dispense:  90 tablet    Refill:  3    Order Specific Question:   Supervising Provider    Answer:   Lavera Guise [[id number]]    Time spent: [place], MD  Internal Medicine

## 2018-11-08 LAB — MICROSCOPIC EXAMINATION: Casts: NONE SEEN /lpf

## 2018-11-08 LAB — UA/M W/RFLX CULTURE, ROUTINE
Bilirubin, UA: NEGATIVE
Glucose, UA: NEGATIVE
Ketones, UA: NEGATIVE
Nitrite, UA: NEGATIVE
Protein,UA: NEGATIVE
RBC, UA: NEGATIVE
Specific Gravity, UA: 1.024 (ref 1.005–1.030)
Urobilinogen, Ur: 0.2 mg/dL (ref 0.2–1.0)
pH, UA: 5 (ref 5.0–7.5)

## 2018-11-08 LAB — URINE CULTURE, REFLEX

## 2018-11-18 ENCOUNTER — Other Ambulatory Visit: Payer: Self-pay

## 2018-11-18 ENCOUNTER — Other Ambulatory Visit: Payer: Self-pay | Admitting: Nurse Practitioner

## 2018-11-18 ENCOUNTER — Ambulatory Visit
Admission: RE | Admit: 2018-11-18 | Discharge: 2018-11-18 | Disposition: A | Discharge status: Home / Self Care | Payer: BLUE CROSS/BLUE SHIELD | Source: Ambulatory Visit | Attending: Nurse Practitioner | Admitting: Nurse Practitioner

## 2018-11-18 DIAGNOSIS — D259 Leiomyoma of uterus, unspecified: Secondary | ICD-10-CM | POA: Insufficient documentation

## 2018-11-23 DIAGNOSIS — D259 Leiomyoma of uterus, unspecified: Secondary | ICD-10-CM | POA: Insufficient documentation

## 2018-11-23 DIAGNOSIS — Z1239 Encounter for other screening for malignant neoplasm of breast: Secondary | ICD-10-CM | POA: Insufficient documentation

## 2018-11-23 DIAGNOSIS — R300 Dysuria: Secondary | ICD-10-CM | POA: Insufficient documentation

## 2018-11-26 ENCOUNTER — Ambulatory Visit: Payer: BLUE CROSS/BLUE SHIELD | Admitting: Nurse Practitioner

## 2018-11-27 ENCOUNTER — Other Ambulatory Visit: Payer: Self-pay

## 2018-11-27 ENCOUNTER — Ambulatory Visit: Payer: BLUE CROSS/BLUE SHIELD | Admitting: Adult Health

## 2018-12-04 ENCOUNTER — Ambulatory Visit (INDEPENDENT_AMBULATORY_CARE_PROVIDER_SITE_OTHER): Payer: BLUE CROSS/BLUE SHIELD | Admitting: Adult Health

## 2018-12-04 ENCOUNTER — Other Ambulatory Visit: Payer: Self-pay

## 2018-12-04 ENCOUNTER — Encounter: Payer: Self-pay | Admitting: Adult Health

## 2018-12-04 VITALS — BP 110/78 | HR 74 | Resp 16 | Ht 63.0 in | Wt 142.0 lb

## 2018-12-04 DIAGNOSIS — Z1211 Encounter for screening for malignant neoplasm of colon: Secondary | ICD-10-CM

## 2018-12-04 DIAGNOSIS — R9389 Abnormal findings on diagnostic imaging of other specified body structures: Secondary | ICD-10-CM

## 2018-12-04 NOTE — Progress Notes (Signed)
[address] [place], [place] [address]  Internal MEDICINE  Office Visit Note  Patient Name: Jean Gilbert  [id number]  [id number]  Date of Service: 12/04/2018  Chief Complaint  Patient presents with  . Medical Management of Chronic Issues    ultrasound and lab follow up  . Quality Metric Gaps    colonoscopy    HPI Patient is here for follow-up on pelvic ultrasound.  Her ultrasound shows a 5.7 cm partially calcified intramural fibroid as well as an endometrial stripe that measures up to 11.3 mm in thickness which is abnormal.  Will refer patient to GYN for follow-up.  There was no external mass or abnormal free fluid noted on the ultrasound.  It is of note that the patient is due for colonoscopy and a referral to GI was placed today at her request.    Current Medication: Outpatient Encounter Medications as of 12/04/2018  Medication Sig  . Cholecalciferol (VITAMIN D3) 1000 units CAPS Take by mouth every other day.  . rosuvastatin (CRESTOR) 5 MG tablet Take one tab po qod  . ibuprofen (ADVIL,MOTRIN) 800 MG tablet Take 1 tablet (800 mg total) by mouth every 8 (eight) hours as needed. (Patient not taking: Reported on 09/15/2018)   No facility-administered encounter medications on file as of 12/04/2018.     Surgical History: Past Surgical History:  Procedure Laterality Date  . BREAST SURGERY Right 1.10.2012   fibroadenoma  . CESAREAN SECTION  2000  . COLONOSCOPY     In her 66's in [place]  . NOSE SURGERY  2004    Medical History: Past Medical History:  Diagnosis Date  . Arthritis 2013   left shoulder  . Breast complaint 2011  . Lump or mass in breast 2012  . Menopause   . Special screening for malignant neoplasms, colon     Family History: Family History  Problem Relation Age of Onset  . Cancer Maternal Uncle        kidney and prostate cancer  . Breast cancer Maternal Aunt   . Heart disease Mother   . Diabetes Mother   .  Hyperlipidemia Mother   . Asthma Maternal Grandmother     Social History   Socioeconomic History  . Marital status: Married    Spouse name: Not on file  . Number of children: Not on file  . Years of education: Not on file  . Highest education level: Not on file  Occupational History  . Not on file  Social Needs  . Financial resource strain: Not on file  . Food insecurity:    Worry: Not on file    Inability: Not on file  . Transportation needs:    Medical: Not on file    Non-medical: Not on file  Tobacco Use  . Smoking status: Never Smoker  . Smokeless tobacco: Never Used  Substance and Sexual Activity  . Alcohol use: No  . Drug use: No  . Sexual activity: Not on file  Lifestyle  . Physical activity:    Days per week: Not on file    Minutes per session: Not on file  . Stress: Not on file  Relationships  . Social connections:    Talks on phone: Not on file    Gets together: Not on file    Attends religious service: Not on file    Active member of club or organization: Not on file    Attends meetings of clubs or organizations: Not on file  Relationship status: Not on file  . Intimate partner violence:    Fear of current or ex partner: Not on file    Emotionally abused: Not on file    Physically abused: Not on file    Forced sexual activity: Not on file  Other Topics Concern  . Not on file  Social History Narrative  . Not on file      Review of Systems  Constitutional: Negative for chills, fatigue and unexpected weight change.  HENT: Negative for congestion, rhinorrhea, sneezing and sore throat.   Eyes: Negative for photophobia, pain and redness.  Respiratory: Negative for cough, chest tightness and shortness of breath.   Cardiovascular: Negative for chest pain and palpitations.  Gastrointestinal: Negative for abdominal pain, constipation, diarrhea, nausea and vomiting.  Endocrine: Negative.   Genitourinary: Negative for dysuria and frequency.   Musculoskeletal: Negative for arthralgias, back pain, joint swelling and neck pain.  Skin: Negative for rash.  Allergic/Immunologic: Negative.   Neurological: Negative for tremors and numbness.  Hematological: Negative for adenopathy. Does not bruise/bleed easily.  Psychiatric/Behavioral: Negative for behavioral problems and sleep disturbance. The patient is not nervous/anxious.     Vital Signs: BP 110/78   Pulse 74   Resp 16   Ht 5\' 3"  (1.6 m)   Wt 142 lb (64.4 kg)   SpO2 99%   BMI 25.15 kg/m    Physical Exam Vitals signs and nursing note reviewed.  Constitutional:      General: She is not in acute distress.    Appearance: She is well-developed. She is not diaphoretic.  HENT:     Head: Normocephalic and atraumatic.     Mouth/Throat:     Pharynx: No oropharyngeal exudate.  Eyes:     Pupils: Pupils are equal, round, and reactive to light.  Neck:     Musculoskeletal: Normal range of motion and neck supple.     Thyroid: No thyromegaly.     Vascular: No JVD.     Trachea: No tracheal deviation.  Cardiovascular:     Rate and Rhythm: Normal rate and regular rhythm.     Heart sounds: Normal heart sounds. No murmur. No friction rub. No gallop.   Pulmonary:     Effort: Pulmonary effort is normal. No respiratory distress.     Breath sounds: Normal breath sounds. No wheezing or rales.  Chest:     Chest wall: No tenderness.  Abdominal:     Palpations: Abdomen is soft.     Tenderness: There is no abdominal tenderness. There is no guarding.  Musculoskeletal: Normal range of motion.  Lymphadenopathy:     Cervical: No cervical adenopathy.  Skin:    General: Skin is warm and dry.  Neurological:     Mental Status: She is alert and oriented to person, place, and time.     Cranial Nerves: No cranial nerve deficit.  Psychiatric:        Behavior: Behavior normal.        Thought Content: Thought content normal.        Judgment: Judgment normal.     Assessment/Plan: 1.  Thickened endometrium Referral for gynecology for follow-up of thickened endometrium placed at this time.  Patient remains asymptomatic overall. - Ambulatory referral to Gynecology  2. Screen for colon cancer GI referral placed for colonoscopy - Ambulatory referral to Gastroenterology  General Counseling: brisa auth understanding of the findings of todays visit and agrees with plan of treatment. I have discussed any further diagnostic evaluation that may  be needed or ordered today. We also reviewed her medications today. she has been encouraged to call the office with any questions or concerns that should arise related to todays visit.    No orders of the defined types were placed in this encounter.   No orders of the defined types were placed in this encounter.   Time spent: 20 Minutes   This patient was seen by Orson Gear AGNP-C in Collaboration with Dr Lavera Guise as a part of collaborative care agreement     Kendell Bane AGNP-C Internal medicine

## 2018-12-16 ENCOUNTER — Telehealth: Payer: Self-pay | Admitting: Obstetrics & Gynecology

## 2018-12-16 DIAGNOSIS — D259 Leiomyoma of uterus, unspecified: Secondary | ICD-10-CM | POA: Diagnosis not present

## 2018-12-16 NOTE — Telephone Encounter (Signed)
[place] referring for Thickened endometrium. Called and left voicemail for patient to call back to be schedule

## 2018-12-17 NOTE — Telephone Encounter (Signed)
Called and left voice mail for patient to call back to be schedule °

## 2018-12-19 DIAGNOSIS — F4322 Adjustment disorder with anxiety: Secondary | ICD-10-CM | POA: Diagnosis not present

## 2018-12-26 NOTE — Telephone Encounter (Signed)
Called and left voice mail for patient to call back to be schedule °

## 2019-07-31 ENCOUNTER — Other Ambulatory Visit: Payer: Self-pay

## 2019-07-31 ENCOUNTER — Ambulatory Visit (INDEPENDENT_AMBULATORY_CARE_PROVIDER_SITE_OTHER): Payer: 59 | Admitting: Otolaryngology

## 2019-07-31 ENCOUNTER — Encounter (INDEPENDENT_AMBULATORY_CARE_PROVIDER_SITE_OTHER): Payer: Self-pay | Admitting: Otolaryngology

## 2019-07-31 VITALS — Temp 98.2°F

## 2019-07-31 DIAGNOSIS — R040 Epistaxis: Secondary | ICD-10-CM | POA: Diagnosis not present

## 2019-07-31 NOTE — Progress Notes (Signed)
HPI: Jean Gilbert is a 54 y.o. female who presents for evaluation of recurrent nosebleeds.  Apparently she has had intermittent nosebleeds for a number of years.  But more recently it has become a little bit worse.  Last nosebleed she had was 2 days ago from the left side.  The nosebleeds are always generally from the left side.  Past Medical History:  Diagnosis Date  . Arthritis 2013   left shoulder  . Breast complaint 2011  . Lump or mass in breast 2012  . Menopause   . Special screening for malignant neoplasms, colon    Past Surgical History:  Procedure Laterality Date  . BREAST SURGERY Right 1.10.2012   fibroadenoma  . CESAREAN SECTION  2000  . COLONOSCOPY     In her 27's in [place]  . NOSE SURGERY  2004   Social History   Socioeconomic History  . Marital status: Married    Spouse name: Not on file  . Number of children: Not on file  . Years of education: Not on file  . Highest education level: Not on file  Occupational History  . Not on file  Tobacco Use  . Smoking status: Never Smoker  . Smokeless tobacco: Never Used  Substance and Sexual Activity  . Alcohol use: No  . Drug use: No  . Sexual activity: Not on file  Other Topics Concern  . Not on file  Social History Narrative  . Not on file   Social Determinants of Health   Financial Resource Strain:   . Difficulty of Paying Living Expenses: Not on file  Food Insecurity:   . Worried About Charity fundraiser in the Last Year: Not on file  . Ran Out of Food in the Last Year: Not on file  Transportation Needs:   . Lack of Transportation (Medical): Not on file  . Lack of Transportation (Non-Medical): Not on file  Physical Activity:   . Days of Exercise per Week: Not on file  . Minutes of Exercise per Session: Not on file  Stress:   . Feeling of Stress : Not on file  Social Connections:   . Frequency of Communication with Friends and Family: Not on file  . Frequency of Social Gatherings with  Friends and Family: Not on file  . Attends Religious Services: Not on file  . Active Member of Clubs or Organizations: Not on file  . Attends Archivist Meetings: Not on file  . Marital Status: Not on file   Family History  Problem Relation Age of Onset  . Cancer Maternal Uncle        kidney and prostate cancer  . Breast cancer Maternal Aunt   . Heart disease Mother   . Diabetes Mother   . Hyperlipidemia Mother   . Asthma Maternal Grandmother    No Known Allergies Prior to Admission medications   Medication Sig Start Date End Date Taking? Authorizing Provider  Cholecalciferol (VITAMIN D3) 1000 units CAPS Take by mouth every other day.    [provider]  ibuprofen (ADVIL,MOTRIN) 800 MG tablet Take 1 tablet (800 mg total) by mouth every 8 (eight) hours as needed. Patient not taking: Reported on 09/15/2018 08/29/16   Everitt Amber, MD  rosuvastatin (CRESTOR) 5 MG tablet Take one tab po qod 11/06/18   Ronnell Freshwater, NP     Positive ROS: Otherwise negative  All other systems have been reviewed and were otherwise negative with the exception of those mentioned in  the HPI and as above.  Physical Exam: Constitutional: Alert, well-appearing, no acute distress Ears: External ears without lesions or tenderness. Ear canals are clear bilaterally with intact, clear TMs.  Nasal: External nose without lesions. Septum relatively midline.  She has a very prominent vessel along the inferior aspect of the left nostril that has been bleeding.  This was cauterized using silver nitrate.  Remaining nasal passageway was clear.  No prominent vessels on the septum itself. Oral: Lips and gums without lesions. Tongue and palate mucosa without lesions. Posterior oropharynx clear. Neck: No palpable adenopathy or masses Respiratory: Breathing comfortably  Skin: No facial/neck lesions or rash noted.  Control of epistaxis  Date/Time: 07/31/2019 5:01 PM Performed by: Rozetta Nunnery,  MD Authorized by: Rozetta Nunnery, MD   Consent:    Consent obtained:  Verbal   Consent given by:  Patient   Risks discussed:  Pain Anesthesia:    Anesthesia method:  Topical application   Topical anesthetic:  Benzocaine spray Procedure details:    Treatment site:  L anterior   Treatment method:  Silver nitrate and oxymetazoline   Treatment complexity:  Limited   Treatment episode: initial   Post-procedure details:    Assessment:  Bleeding stopped   Patient tolerance of procedure:  Tolerated well, no immediate complications Comments:     The vessel was anterior inferior at the junction of the septum and floor of the nose.    Assessment: Recurrent epistaxis  Plan: This was cauterized in the office using silver nitrate.  She will follow-up as needed.  Radene Journey, MD

## 2019-08-06 ENCOUNTER — Ambulatory Visit (INDEPENDENT_AMBULATORY_CARE_PROVIDER_SITE_OTHER): Payer: BLUE CROSS/BLUE SHIELD | Admitting: Otolaryngology

## 2019-08-10 ENCOUNTER — Ambulatory Visit
Admission: RE | Admit: 2019-08-10 | Discharge: 2019-08-10 | Disposition: A | Discharge status: Home / Self Care | Payer: 59 | Source: Ambulatory Visit | Attending: Nurse Practitioner | Admitting: Nurse Practitioner

## 2019-08-10 ENCOUNTER — Other Ambulatory Visit: Payer: Self-pay

## 2019-08-10 DIAGNOSIS — Z1239 Encounter for other screening for malignant neoplasm of breast: Secondary | ICD-10-CM

## 2019-08-12 NOTE — Progress Notes (Signed)
Negative mammogram

## 2020-03-11 IMAGING — MG DIGITAL SCREENING BILAT W/ CAD
4 series · 4 of 4 positions shown · non-contrast
Comparison: Previous exam(s).

CLINICAL DATA: Screening.

EXAM:
DIGITAL SCREENING BILATERAL MAMMOGRAM WITH CAD

[R MLO]
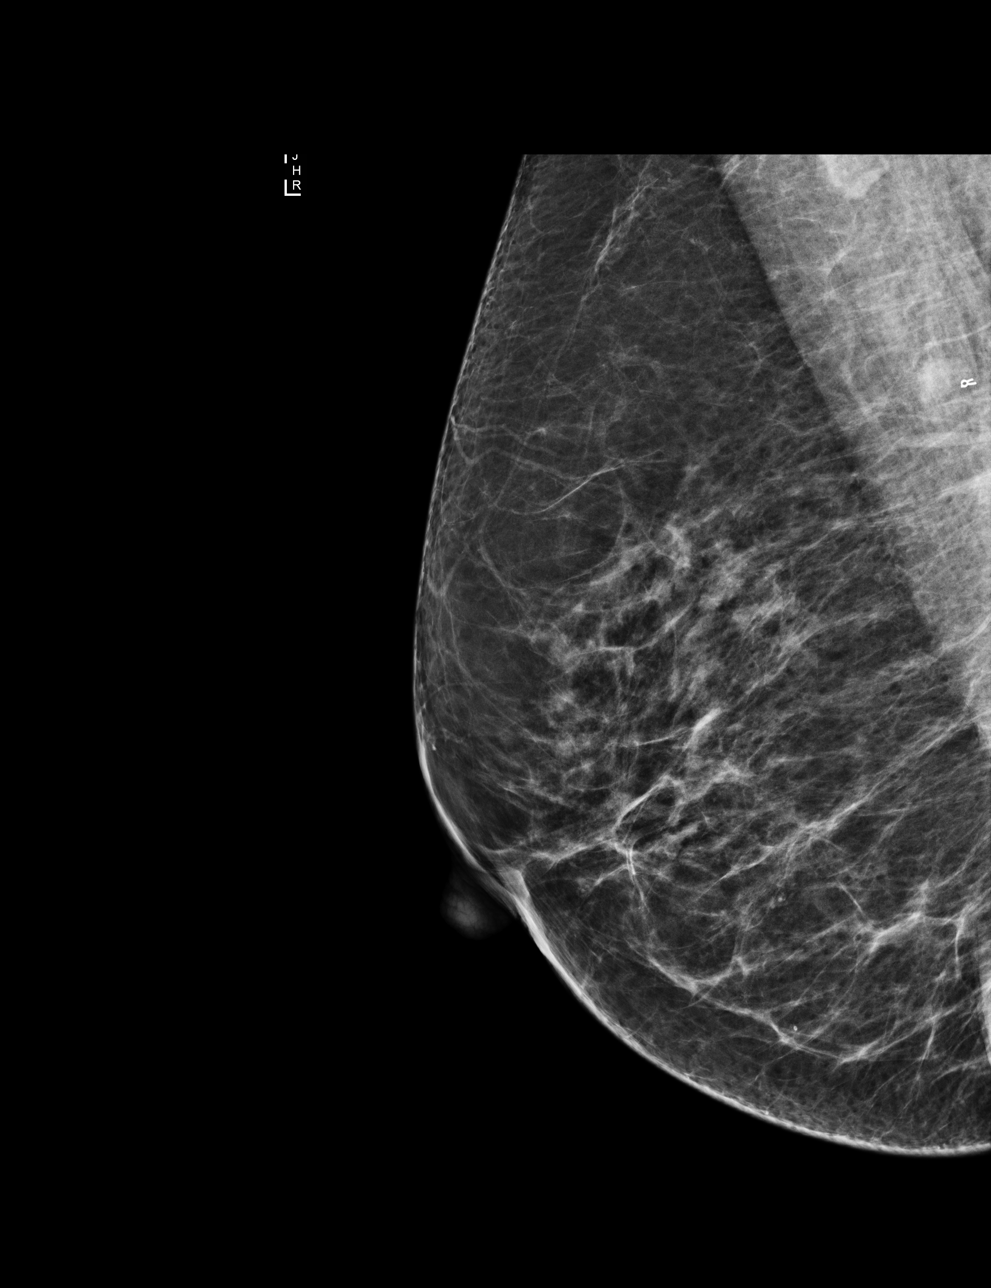

[R CC]
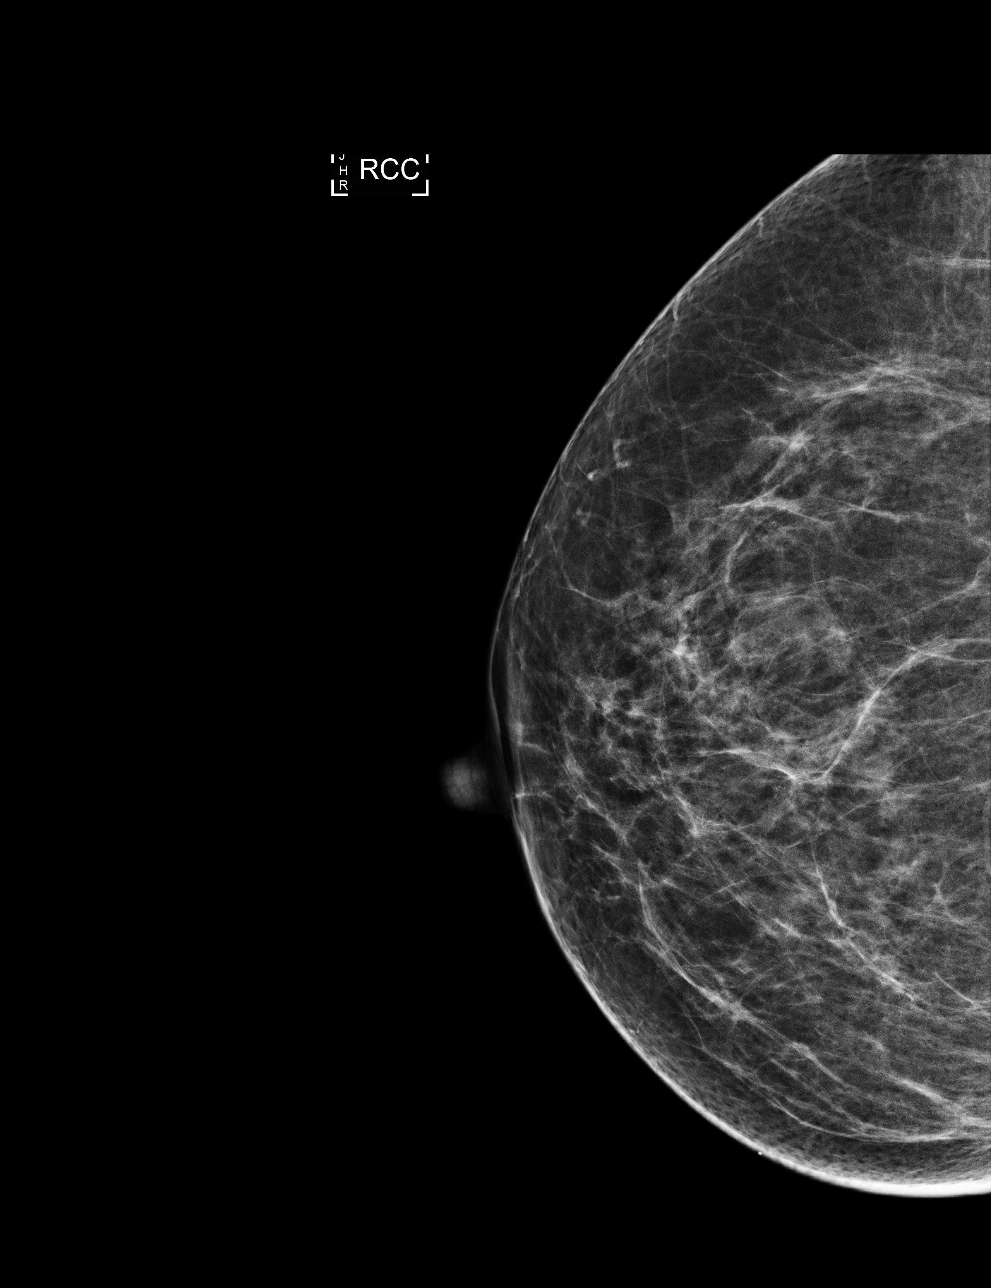

[L MLO]
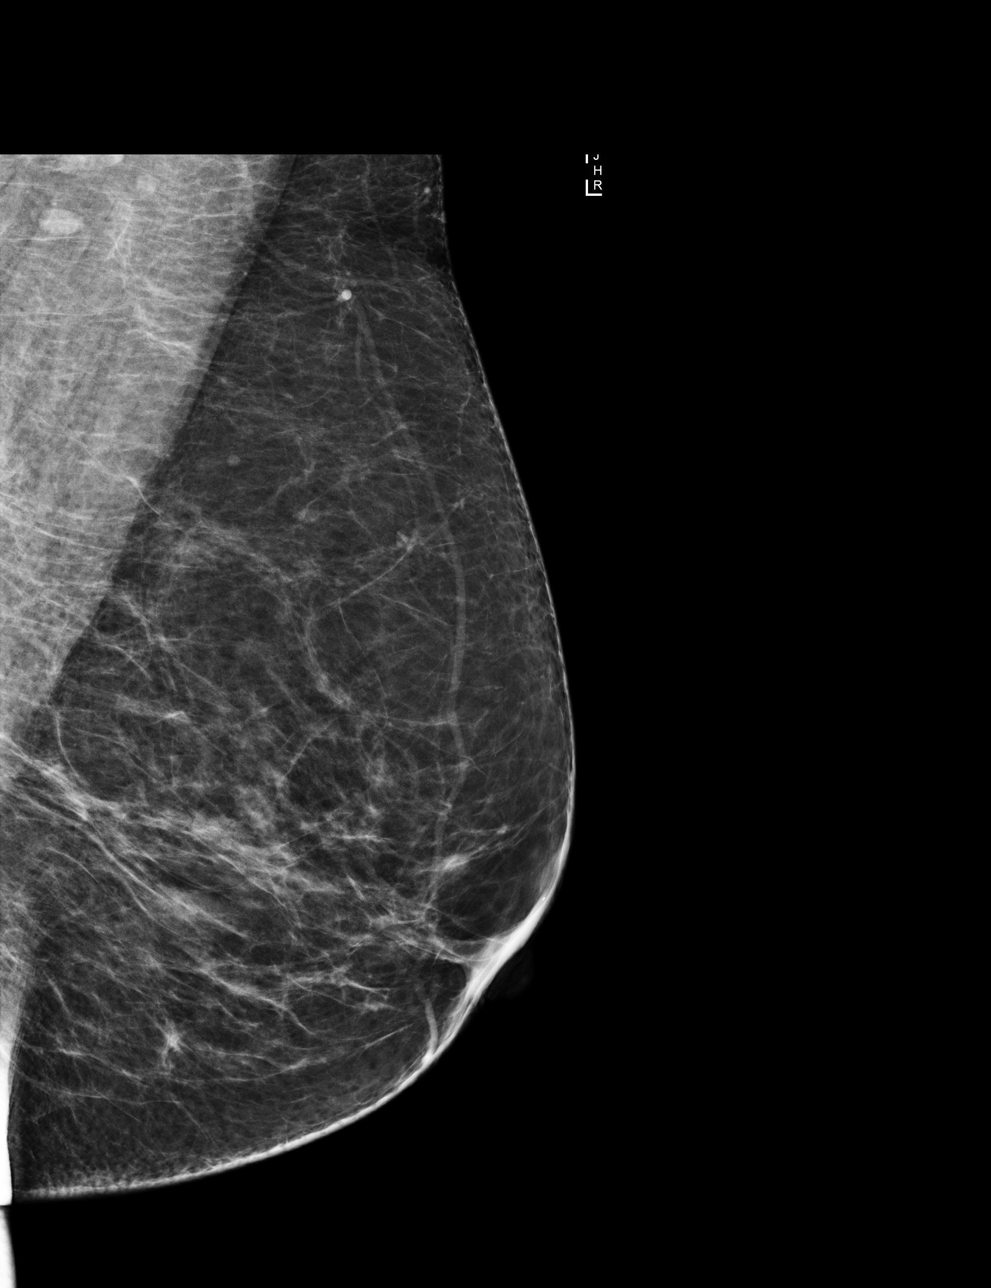

[L CC]
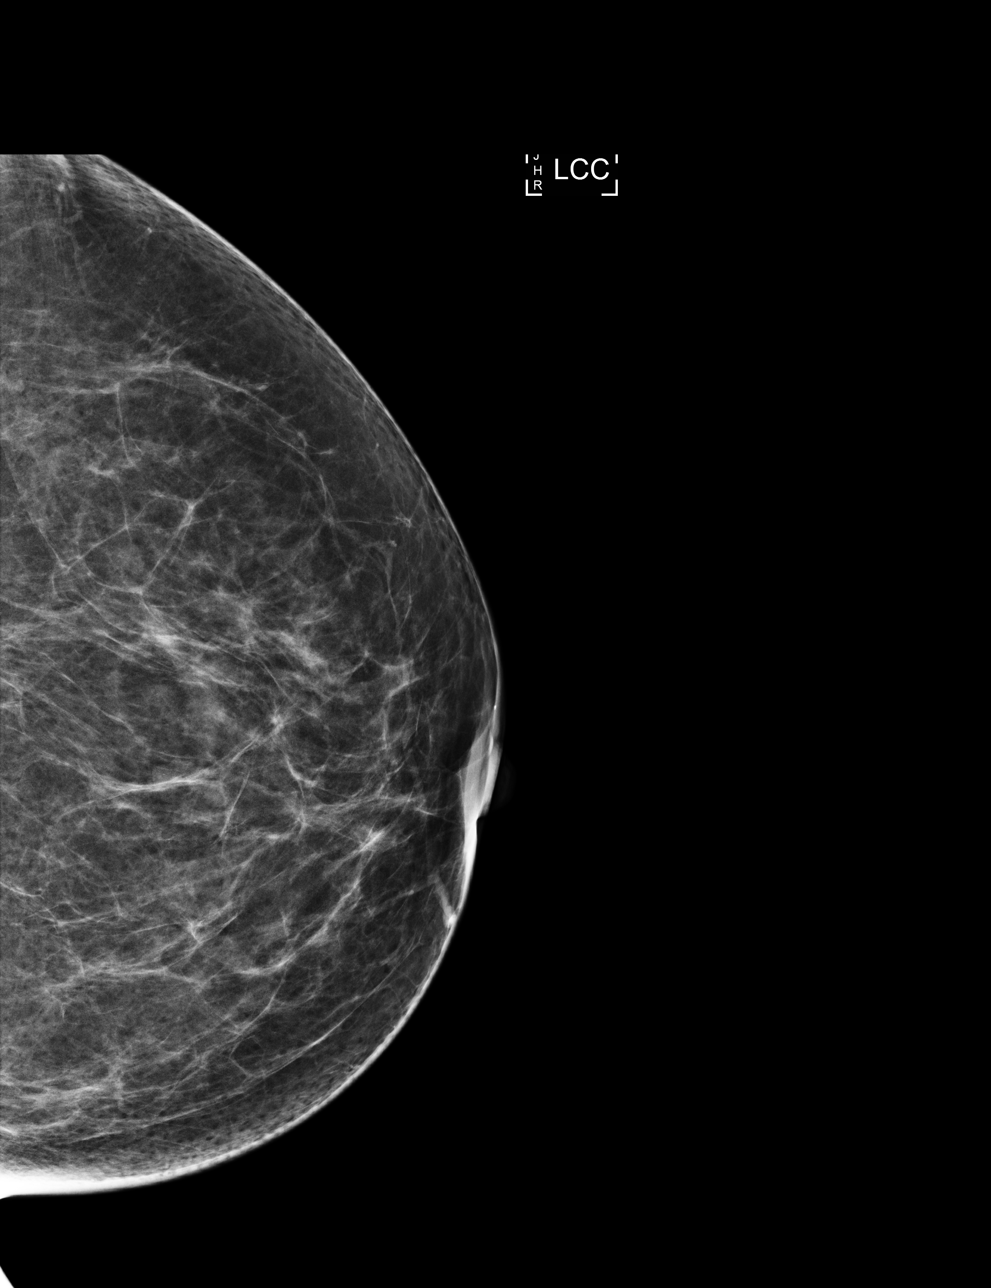

[4 of 4 positions shown; findings below may reference images not displayed]

ACR Breast Density Category b: There are scattered areas of
fibroglandular density.
FINDINGS: There are no findings suspicious for malignancy. Images were
processed with CAD.
IMPRESSION: No mammographic evidence of malignancy. A result letter of this
screening mammogram will be mailed directly to the patient.

RECOMMENDATION:
Screening mammogram in one year. (Code:AS-G-LCT)

BI-RADS CATEGORY  1: Negative.

## 2020-07-18 ENCOUNTER — Other Ambulatory Visit: Payer: Self-pay | Admitting: Nurse Practitioner

## 2020-07-18 DIAGNOSIS — Z1231 Encounter for screening mammogram for malignant neoplasm of breast: Secondary | ICD-10-CM

## 2020-08-23 ENCOUNTER — Encounter: Payer: Self-pay | Admitting: Internal Medicine

## 2020-08-23 ENCOUNTER — Other Ambulatory Visit: Payer: Self-pay

## 2020-08-23 ENCOUNTER — Ambulatory Visit (INDEPENDENT_AMBULATORY_CARE_PROVIDER_SITE_OTHER): Payer: No Typology Code available for payment source | Admitting: Internal Medicine

## 2020-08-23 VITALS — BP 119/82 | HR 85 | Resp 18 | Ht 64.0 in | Wt 145.1 lb

## 2020-08-23 DIAGNOSIS — D259 Leiomyoma of uterus, unspecified: Secondary | ICD-10-CM

## 2020-08-23 DIAGNOSIS — Z7689 Persons encountering health services in other specified circumstances: Secondary | ICD-10-CM | POA: Diagnosis not present

## 2020-08-23 DIAGNOSIS — K219 Gastro-esophageal reflux disease without esophagitis: Secondary | ICD-10-CM | POA: Diagnosis not present

## 2020-08-23 DIAGNOSIS — E785 Hyperlipidemia, unspecified: Secondary | ICD-10-CM

## 2020-08-23 DIAGNOSIS — Z124 Encounter for screening for malignant neoplasm of cervix: Secondary | ICD-10-CM

## 2020-08-23 DIAGNOSIS — G4700 Insomnia, unspecified: Secondary | ICD-10-CM

## 2020-08-23 DIAGNOSIS — M542 Cervicalgia: Secondary | ICD-10-CM

## 2020-08-23 DIAGNOSIS — E559 Vitamin D deficiency, unspecified: Secondary | ICD-10-CM

## 2020-08-23 MED ORDER — FAMOTIDINE 40 MG PO TABS: 40.0000 mg | ORAL_TABLET | Freq: Every day | ORAL | 2 refills | Status: DC

## 2020-08-23 MED ORDER — TRAZODONE HCL 50 MG PO TABS: 25.0000 mg | ORAL_TABLET | Freq: Every evening | ORAL | 3 refills | Status: DC | PRN

## 2020-08-23 NOTE — Assessment & Plan Note (Signed)
Associated with left shoulder pain, worse with lifting patients at work Avoid heavy lifting Heating pads or ice Referral to Orthopedic surgeon provided due to concern for contracture of UE muscles as well

## 2020-08-23 NOTE — Patient Instructions (Signed)
Please start taking Pepcid for acid reflux.  Okay to take Simethicone/GasX for bloating.  Try to avoid hot and spicy food.

## 2020-08-24 ENCOUNTER — Other Ambulatory Visit: Payer: Self-pay | Admitting: Internal Medicine

## 2020-08-24 DIAGNOSIS — E782 Mixed hyperlipidemia: Secondary | ICD-10-CM

## 2020-08-24 LAB — TSH+FREE T4
Free T4: 1.19 ng/dL (ref 0.82–1.77)
TSH: 2.65 u[IU]/mL (ref 0.450–4.500)

## 2020-08-24 LAB — CBC WITH DIFFERENTIAL/PLATELET
Basophils Absolute: 0 10*3/uL (ref 0.0–0.2)
Basos: 0 %
EOS (ABSOLUTE): 0.4 10*3/uL (ref 0.0–0.4)
Eos: 4 %
Hematocrit: 42.5 % (ref 34.0–46.6)
Hemoglobin: 14.1 g/dL (ref 11.1–15.9)
Immature Grans (Abs): 0 10*3/uL (ref 0.0–0.1)
Immature Granulocytes: 0 %
Lymphocytes Absolute: 4.4 10*3/uL — ABNORMAL HIGH (ref 0.7–3.1)
Lymphs: 51 %
MCH: 28.3 pg (ref 26.6–33.0)
MCHC: 33.2 g/dL (ref 31.5–35.7)
MCV: 85 fL (ref 79–97)
Monocytes Absolute: 0.7 10*3/uL (ref 0.1–0.9)
Monocytes: 8 %
Neutrophils Absolute: 3.2 10*3/uL (ref 1.4–7.0)
Neutrophils: 37 %
Platelets: 308 10*3/uL (ref 150–450)
RBC: 4.99 x10E6/uL (ref 3.77–5.28)
RDW: 12.7 % (ref 11.7–15.4)
WBC: 8.6 10*3/uL (ref 3.4–10.8)

## 2020-08-24 LAB — VITAMIN D 25 HYDROXY (VIT D DEFICIENCY, FRACTURES): Vit D, 25-Hydroxy: 23.3 ng/mL — ABNORMAL LOW (ref 30.0–100.0)

## 2020-08-24 LAB — LIPID PANEL
Chol/HDL Ratio: 8.5 ratio — ABNORMAL HIGH (ref 0.0–4.4)
Cholesterol, Total: 297 mg/dL — ABNORMAL HIGH (ref 100–199)
HDL: 35 mg/dL — ABNORMAL LOW (ref >39)
Triglycerides: 824 mg/dL (ref 0–149)

## 2020-08-24 LAB — HEMOGLOBIN A1C
Est. average glucose Bld gHb Est-mCnc: 120 mg/dL
Hgb A1c MFr Bld: 5.8 % — ABNORMAL HIGH (ref 4.8–5.6)

## 2020-08-24 MED ORDER — ROSUVASTATIN CALCIUM 10 MG PO TABS: 10.0000 mg | ORAL_TABLET | Freq: Every day | ORAL | 1 refills | Status: DC

## 2020-08-26 ENCOUNTER — Ambulatory Visit
Admission: RE | Admit: 2020-08-26 | Discharge: 2020-08-26 | Disposition: A | Discharge status: Home / Self Care | Payer: 59 | Source: Ambulatory Visit | Attending: Nurse Practitioner | Admitting: Nurse Practitioner

## 2020-08-26 ENCOUNTER — Other Ambulatory Visit: Payer: Self-pay

## 2020-08-26 DIAGNOSIS — Z1231 Encounter for screening mammogram for malignant neoplasm of breast: Secondary | ICD-10-CM

## 2020-08-28 DIAGNOSIS — Z7689 Persons encountering health services in other specified circumstances: Secondary | ICD-10-CM | POA: Insufficient documentation

## 2020-08-28 NOTE — Assessment & Plan Note (Signed)
Related to work and family related stress Started Trazodone

## 2020-08-28 NOTE — Assessment & Plan Note (Signed)
Lipid profile reviewed Started Crestor

## 2020-08-28 NOTE — Progress Notes (Signed)
New Patient Office Visit  Subjective:  Patient ID: Jean Gilbert, female    DOB: 1965/09/26  Age: 55 y.o. MRN: [id number]  CC:  Chief Complaint  Patient presents with  . New Patient (Initial Visit)        HPI [name] is a 55 year old female with PMH of HLD and neck pain who presents for establishing care.  She c/o left more than tight shoulder pain, worse with movement associated with neck pain. Pain is constant, but worse with her work related shoulder strain. 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 She denies any numbness or weakness of the UE. She has been having intermittent stiffness of the left hand, and has to passively open her fist sometimes. She denies any recent injury.  She also c/o acid reflux and bloating sensation at times. Denies any nausea, vomiting, diarrhea, melena or hematochezia.  She has been having sleeping difficulty. She has been going through family issues and has been stressed lately. She had crying spell while talking about her stress. She denies any suicidal or homicidal ideation.  She has a h/o HLD .  She has had 2 doses of COVID vaccine.  Past Medical History:  Diagnosis Date  . Arthritis 2013   left shoulder  . Breast complaint 2011  . Lump or mass in breast 2012  . Menopause   . Special screening for malignant neoplasms, colon     Past Surgical History:  Procedure Laterality Date  . BREAST BIOPSY Right 2012   benign  . BREAST SURGERY Right 1.10.2012   fibroadenoma  . CESAREAN SECTION  2000  . COLONOSCOPY     In her 53's in [place]  . NOSE SURGERY  2004    Family History  Problem Relation Age of Onset  . Cancer Maternal Uncle        kidney and prostate cancer  . Breast cancer Maternal Aunt   . Heart disease Mother   . Diabetes Mother   . Hyperlipidemia Mother   . Asthma Maternal Grandmother     Social History    Socioeconomic History  . Marital status: Married    Spouse name: Not on file  . Number of children: Not on file  . Years of education: Not on file  . Highest education level: Not on file  Occupational History  . Not on file  Tobacco Use  . Smoking status: Never Smoker  . Smokeless tobacco: Never Used  Substance and Sexual Activity  . Alcohol use: No  . Drug use: No  . Sexual activity: Not on file  Other Topics Concern  . Not on file  Social History Narrative  . Not on file   Social Determinants of Health   Financial Resource Strain: Not on file  Food Insecurity: Not on file  Transportation Needs: Not on file  Physical Activity: Not on file  Stress: Not on file  Social Connections: Not on file  Intimate Partner Violence: Not on file    ROS Review of Systems  Constitutional: Negative for chills and fever.  HENT: Negative for congestion, sinus pressure, sinus pain and sore throat.   Eyes: Negative for pain and discharge.  Respiratory: Negative for cough and shortness of breath.   Cardiovascular: Negative for chest pain and palpitations.  Gastrointestinal: Negative for abdominal pain, constipation, diarrhea, nausea and vomiting.  Endocrine: Negative for polydipsia and polyuria.  Genitourinary: Negative for  dysuria and hematuria.  Musculoskeletal: Positive for arthralgias and neck pain. Negative for neck stiffness.  Skin: Negative for rash.  Neurological: Negative for dizziness and weakness.  Psychiatric/Behavioral: Positive for sleep disturbance. Negative for agitation and behavioral problems. The patient is nervous/anxious.     Objective:   Today's Vitals: BP 119/82 (BP Location: Right Arm, Patient Position: Sitting, Cuff Size: Normal)   Pulse 85   Resp 18   Ht 5\' 4"  (1.626 m)   Wt 145 lb 1.9 oz (65.8 kg)   SpO2 98%   BMI 24.91 kg/m   Physical Exam Vitals reviewed.  Constitutional:      General: She is not in acute distress.    Appearance: She is not  diaphoretic.  HENT:     Head: Normocephalic and atraumatic.     Nose: Nose normal.     Mouth/Throat:     Mouth: Mucous membranes are moist.  Eyes:     General: No scleral icterus.    Extraocular Movements: Extraocular movements intact.     Pupils: Pupils are equal, round, and reactive to light.  Cardiovascular:     Rate and Rhythm: Normal rate and regular rhythm.     Pulses: Normal pulses.     Heart sounds: Normal heart sounds. No murmur heard.   Pulmonary:     Breath sounds: Normal breath sounds. No wheezing or rales.  Abdominal:     Palpations: Abdomen is soft.     Tenderness: There is no abdominal tenderness.  Musculoskeletal:     Right shoulder: No bony tenderness. Decreased range of motion.     Left shoulder: No bony tenderness. Decreased range of motion (due to pain).     Cervical back: Neck supple. No tenderness.     Right lower leg: No edema.     Left lower leg: No edema.  Skin:    General: Skin is warm.     Findings: No rash.  Neurological:     General: No focal deficit present.     Mental Status: She is alert and oriented to person, place, and time.  Psychiatric:        Mood and Affect: Mood normal.        Behavior: Behavior normal.     Assessment & Plan:   Problem List Items Addressed This Visit      Digestive   Gastroesophageal reflux disease    C/o acid reflux and bloating sensation Started Pepcid      Relevant Medications   famotidine (PEPCID) 40 MG tablet     Other   Vitamin D deficiency, unspecified    On Vitamin D supplement.      Hyperlipidemia, unspecified    Lipid profile reviewed Started Crestor      Insomnia, unspecified    Related to work and family related stress Started Trazodone      Relevant Medications   traZODone (DESYREL) 50 MG tablet   Neck pain    Associated with left shoulder pain, worse with lifting patients at work Avoid heavy lifting Heating pads or ice Referral to Orthopedic surgeon provided due to concern  for contracture of UE muscles as well      Relevant Orders   Ambulatory referral to Orthopedic Surgery   Encounter to establish care - Primary    Care established Previous chart reviewed History and medications reviewed with the patient      Relevant Orders   CBC   CBC with Differential/Platelet (Completed)   Hemoglobin A1c (Completed)  Lipid panel (Completed)   TSH + free T4 (Completed)   Vitamin D (25 hydroxy) (Completed)    Other Visit Diagnoses    Routine cervical smear       Relevant Orders   Ambulatory referral to Obstetrics / Gynecology      Outpatient Encounter Medications as of 08/23/2020  Medication Sig  . Cholecalciferol (VITAMIN D3) 1000 units CAPS Take by mouth every other day.  . famotidine (PEPCID) 40 MG tablet Take 1 tablet (40 mg total) by mouth daily.  Marland Kitchen ibuprofen (ADVIL,MOTRIN) 800 MG tablet Take 1 tablet (800 mg total) by mouth every 8 (eight) hours as needed.  . traZODone (DESYREL) 50 MG tablet Take 0.5-1 tablets (25-50 mg total) by mouth at bedtime as needed for sleep.  . [DISCONTINUED] rosuvastatin (CRESTOR) 5 MG tablet Take one tab po qod (Patient not taking: Reported on 08/23/2020)   No facility-administered encounter medications on file as of 08/23/2020.    Follow-up: Return in about 3 months (around 11/20/2020).   Lindell Spar, MD

## 2020-08-28 NOTE — Assessment & Plan Note (Signed)
On Vitamin D supplement

## 2020-08-28 NOTE — Assessment & Plan Note (Signed)
C/o acid reflux and bloating sensation Started Pepcid

## 2020-08-28 NOTE — Assessment & Plan Note (Signed)
Care established Previous chart reviewed History and medications reviewed with the patient 

## 2020-09-01 ENCOUNTER — Ambulatory Visit: Payer: No Typology Code available for payment source | Admitting: Family Medicine

## 2020-10-27 ENCOUNTER — Ambulatory Visit (INDEPENDENT_AMBULATORY_CARE_PROVIDER_SITE_OTHER): Payer: Self-pay | Admitting: Dermatology

## 2020-10-27 ENCOUNTER — Other Ambulatory Visit: Payer: Self-pay

## 2020-10-27 DIAGNOSIS — L988 Other specified disorders of the skin and subcutaneous tissue: Secondary | ICD-10-CM

## 2020-10-27 NOTE — Progress Notes (Signed)
   New Patient Visit  Subjective  Jean Gilbert is a 55 y.o. female who presents for the following: Facial Elastosis (Face, ).  The following portions of the chart were reviewed this encounter and updated as appropriate:   Tobacco  Allergies  Meds  Problems  Med Hx  Surg Hx  Fam Hx     Review of Systems:  No other skin or systemic complaints except as noted in HPI or Assessment and Plan.  Objective  Well appearing patient in no apparent distress; mood and affect are within normal limits.  A focused examination was performed including face. Relevant physical exam findings are noted in the Assessment and Plan.  Objective  face: Rhytides and volume loss.   Images                 Assessment & Plan  Elastosis of skin face Discussed Botox 25 units to frown complex, 7.5 units for forehead, and 10 units each crows feet side total of 20 units We had a long discussion.  Patient wants to try the frown complex only and alone today.  She may consider other areas in the future.  Botox 25 units today to: - Frown Complex 25   Botox Injection - face Location: Frown complex  Informed consent: Discussed risks (infection, pain, bleeding, bruising, swelling, allergic reaction, paralysis of nearby muscles, eyelid droop, double vision, neck weakness, difficulty breathing, headache, undesirable cosmetic result, and need for additional treatment) and benefits of the procedure, as well as the alternatives.  Informed consent was obtained.  Preparation: The area was cleansed with alcohol.  Procedure Details:  Botox was injected into the dermis with a 30-gauge needle. Pressure applied to any bleeding. Ice packs offered for swelling.  Lot Number:  [id number] Expiration:  12/2022  Total Units Injected:  25  Plan: Patient was instructed to remain upright for 4 hours. Patient was instructed to avoid massaging the face and avoid vigorous exercise for the rest of the day. Tylenol  may be used for headache.  Allow 2 weeks before returning to clinic for additional dosing as needed. Patient will call for any problems.  Return for 3-4wks for Botox f/u.  I, Othelia Pulling, RMA, am acting as scribe for Sarina Ser, MD .  Documentation: I have reviewed the above documentation for accuracy and completeness, and I agree with the above.  Sarina Ser, MD

## 2020-10-27 NOTE — Patient Instructions (Signed)

## 2020-10-30 ENCOUNTER — Encounter: Payer: Self-pay | Admitting: Dermatology

## 2020-11-09 IMAGING — US US PELVIS COMPLETE
1 series · 14 of 25 positions shown · non-contrast
Comparison: None available.

CLINICAL DATA: Initial evaluation for uterine fibroid/cyst.

EXAM:
TRANSABDOMINAL ULTRASOUND OF PELVIS
TECHNIQUE: Transabdominal ultrasound examination of the pelvis was performed
including evaluation of the uterus, ovaries, adnexal regions, and
pelvic cul-de-sac.

[Series 1: us pelvis complete · 0.20mm/px · 50 acquisitions, 14 frames shown]
[im 1/50]
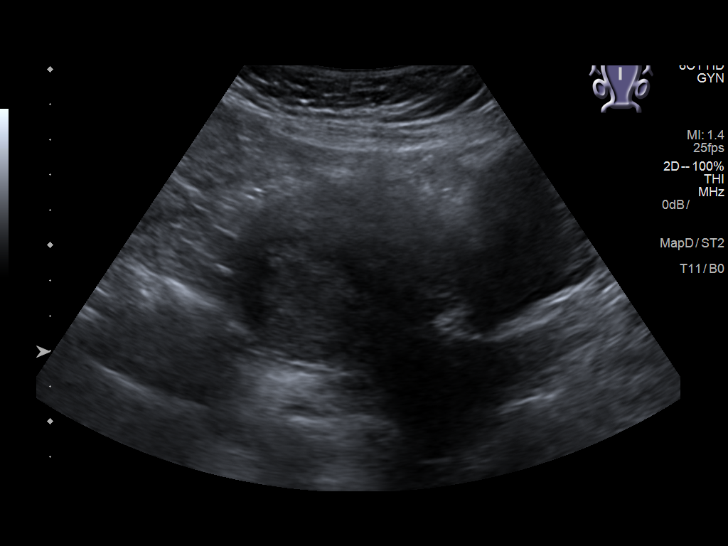
[im 5/50]
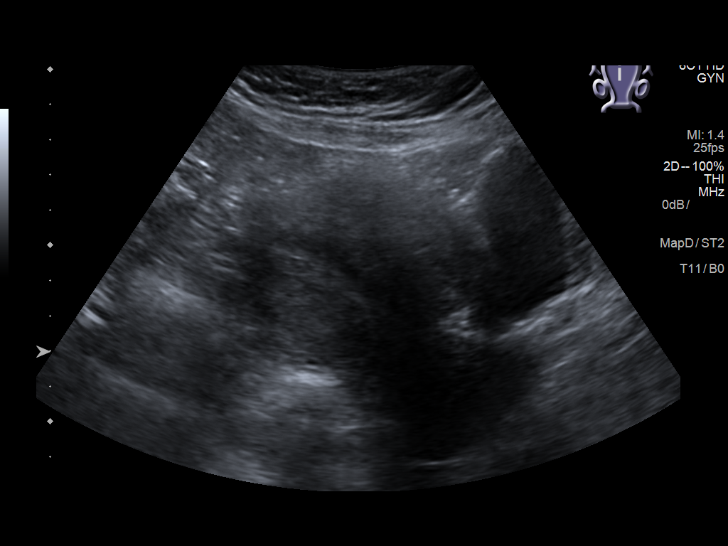
[im 9/50]
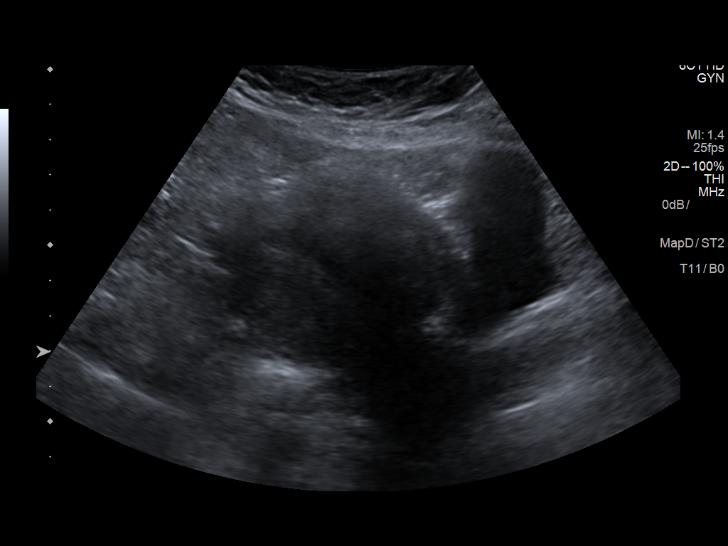
[im 13/50]
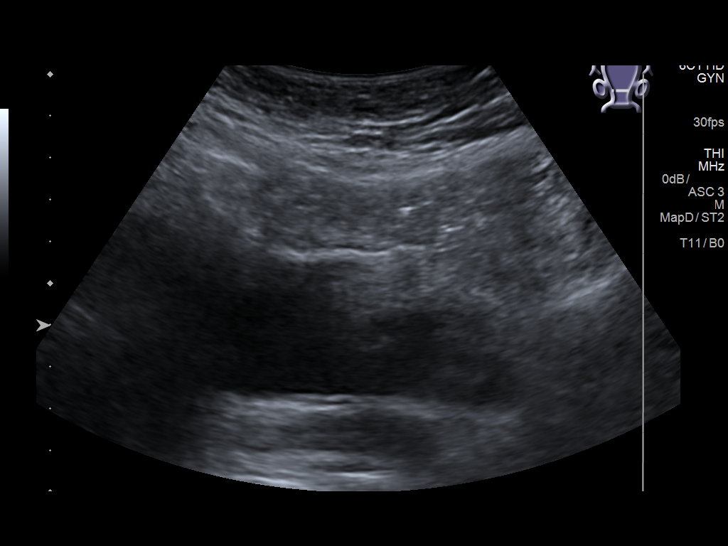
[im 17/50]
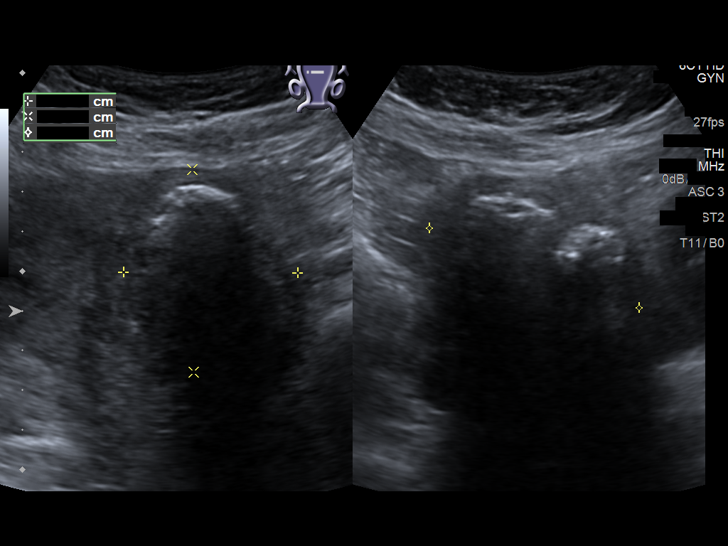
[im 19/50]
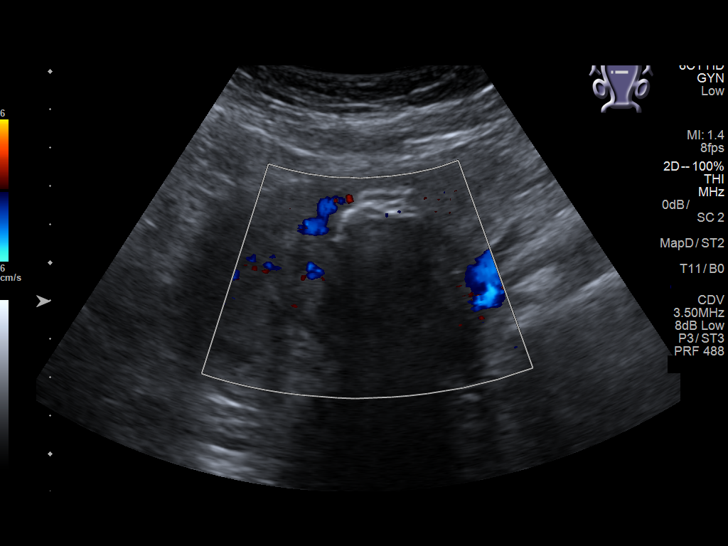
[im 23/50]
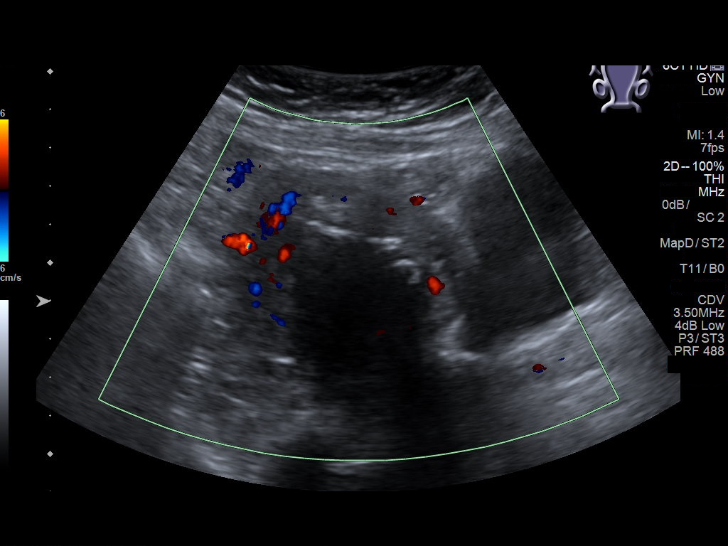
[im 27/50]
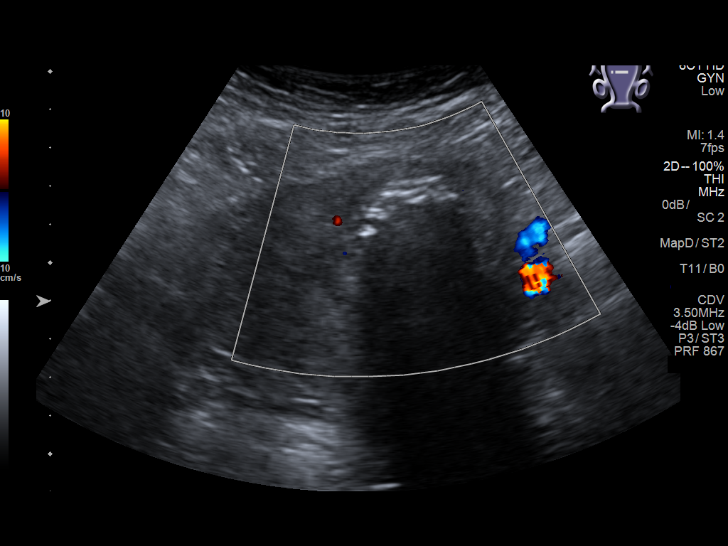
[im 31/50]
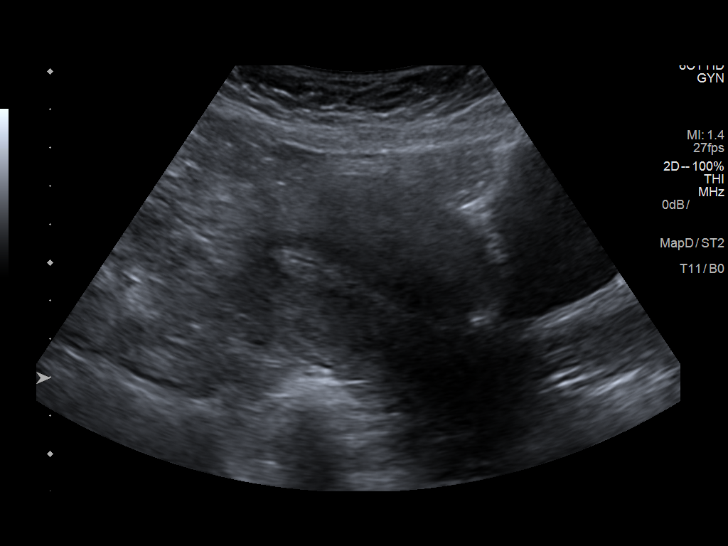
[im 33/50]
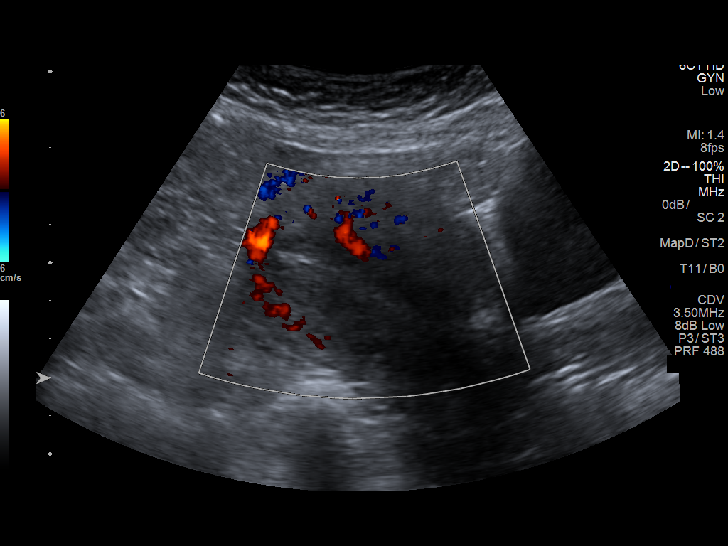
[im 37/50]
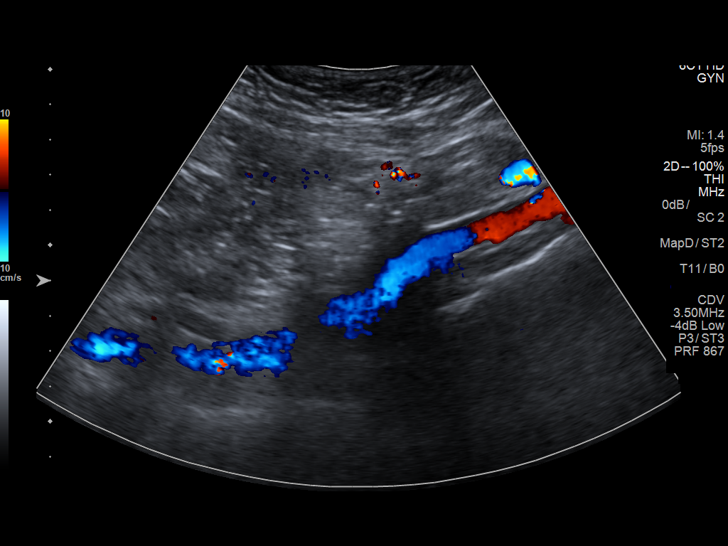
[im 41/50]
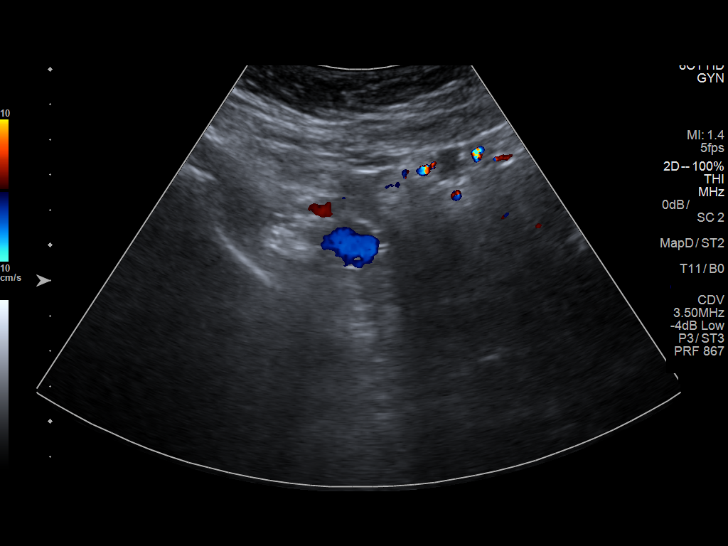
[im 45/50]
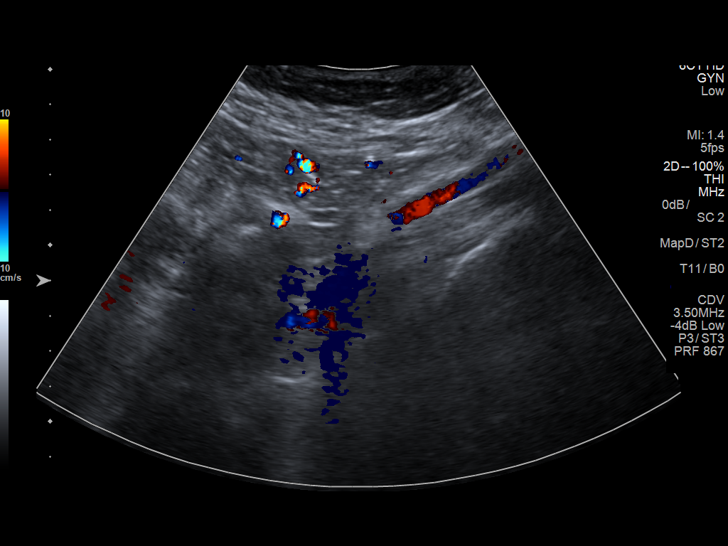
[im 50/50]
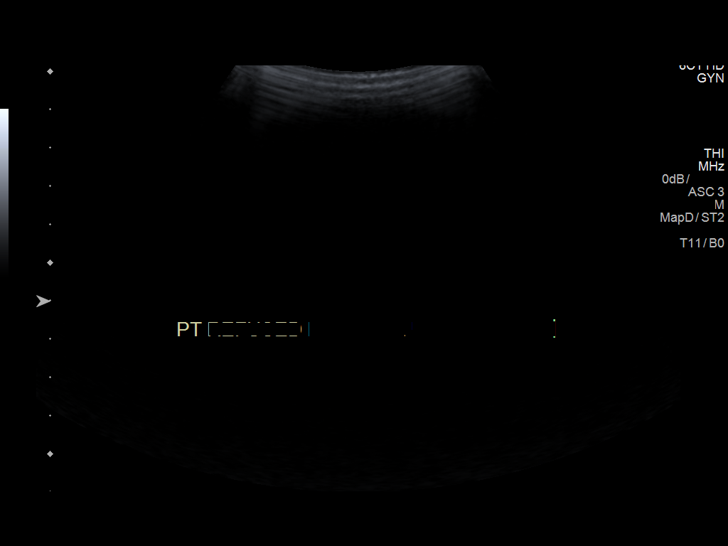

[14 of 25 positions shown; findings below may reference images not displayed]

FINDINGS: Uterus

Measurements: 9.2 x 5.3 x 6.3 cm = volume: 160.4 mL. 5.7 x 5.1 x
cm hypoechoic lesion within the left anterior uterine body likely
reflects an intramural fibroid. Heterogeneous echogenic areas with
posterior acoustic shadowing within this lesion consistent with
calcifications.

Endometrium

Thickness: 11.3 mm.  No focal abnormality visualized.

Right ovary

Not visualized.  No adnexal mass.

Left ovary

Not visualized.  No adnexal mass.

Other findings:  No abnormal free fluid.
IMPRESSION: 1. 5.7 cm partially calcified intramural fibroid.
2. Endometrial stripe measures up to 11.3 mm in thickness.
Endometrial thickness is considered abnormal for an asymptomatic
post-menopausal female. Endometrial sampling should be considered to
exclude carcinoma.
3. Nonvisualization of the ovaries. No adnexal mass or abnormal free
fluid.

## 2020-11-17 ENCOUNTER — Ambulatory Visit (INDEPENDENT_AMBULATORY_CARE_PROVIDER_SITE_OTHER): Payer: No Typology Code available for payment source | Admitting: Obstetrics and Gynecology

## 2020-11-17 ENCOUNTER — Encounter: Payer: Self-pay | Admitting: Obstetrics and Gynecology

## 2020-11-17 ENCOUNTER — Other Ambulatory Visit: Payer: Self-pay

## 2020-11-17 ENCOUNTER — Other Ambulatory Visit (HOSPITAL_COMMUNITY)
Admission: RE | Admit: 2020-11-17 | Discharge: 2020-11-17 | Disposition: A | Discharge status: Home / Self Care | Payer: No Typology Code available for payment source | Source: Ambulatory Visit | Attending: Obstetrics and Gynecology | Admitting: Obstetrics and Gynecology

## 2020-11-17 VITALS — BP 131/81 | HR 70 | Ht 62.0 in | Wt 143.0 lb

## 2020-11-17 DIAGNOSIS — Z01419 Encounter for gynecological examination (general) (routine) without abnormal findings: Secondary | ICD-10-CM

## 2020-11-17 DIAGNOSIS — Z1231 Encounter for screening mammogram for malignant neoplasm of breast: Secondary | ICD-10-CM | POA: Diagnosis not present

## 2020-11-17 NOTE — Progress Notes (Signed)
Subjective:     Jean Gilbert is a 55 y.o. female postmenopausal with BMI 26 who is here for a comprehensive physical exam. The patient reports no problems. She is not sexually active. She denies pelvic pain or abnormal discharge. She denies any episodes of postmenopausal vaginal bleeding. She denies urinary incontinence. Patient is without any complaints  Past Medical History:  Diagnosis Date  . Anemia   . Arthritis 2013   left shoulder  . Breast complaint 2011  . Fibroid   . Lump or mass in breast 2012  . Menopause   . Special screening for malignant neoplasms, colon   . Vaginal Pap smear, abnormal    Past Surgical History:  Procedure Laterality Date  . BREAST BIOPSY Right 2012   benign  . BREAST SURGERY Right 1.10.2012   fibroadenoma  . CESAREAN SECTION  2000  . COLONOSCOPY     In her 30's in [place]  . NOSE SURGERY  2004   Family History  Problem Relation Age of Onset  . Cancer Maternal Uncle        kidney and prostate cancer  . Breast cancer Maternal Aunt   . Heart disease Mother   . Diabetes Mother   . Hyperlipidemia Mother   . Hypertension Mother   . Asthma Maternal Grandmother     Social History   Socioeconomic History  . Marital status: Legally Separated    Spouse name: Not on file  . Number of children: 2  . Years of education: Not on file  . Highest education level: Not on file  Occupational History  . Not on file  Tobacco Use  . Smoking status: Never Smoker  . Smokeless tobacco: Never Used  Vaping Use  . Vaping Use: Never used  Substance and Sexual Activity  . Alcohol use: No  . Drug use: No  . Sexual activity: Not Currently  Other Topics Concern  . Not on file  Social History Narrative  . Not on file   Social Determinants of Health   Financial Resource Strain: Not on file  Food Insecurity: Not on file  Transportation Needs: Not on file  Physical Activity: Not on file  Stress: Not on file  Social Connections: Not on file   Intimate Partner Violence: Not on file   Health Maintenance  Topic Date Due  . HIV Screening  Never done  . Hepatitis C Screening  Never done  . TETANUS/TDAP  Never done  . COLONOSCOPY (Pts 45-4yrs Insurance coverage will need to be confirmed)  03/25/2020  . INFLUENZA VACCINE  02/06/2021  . MAMMOGRAM  08/09/2021  . PAP SMEAR-Modifier  11/05/2021  . HPV VACCINES  Aged Out       Review of Systems Pertinent items noted in HPI and remainder of comprehensive ROS otherwise negative.   Objective:  Blood pressure 131/81, pulse 70, height 5\' 2"  (1.575 m), weight 143 lb (64.9 kg).     GENERAL: Well-developed, well-nourished female in no acute distress.  HEENT: Normocephalic, atraumatic. Sclerae anicteric.  NECK: Supple. Normal thyroid.  LUNGS: Clear to auscultation bilaterally.  HEART: Regular rate and rhythm. BREASTS: Symmetric in size. No palpable masses or lymphadenopathy, skin changes, or nipple drainage. ABDOMEN: Soft, nontender, nondistended. No organomegaly. PELVIC: Normal external female genitalia. Vagina is pink and rugated.  Normal discharge. Normal appearing cervix. Uterus is normal in size. No adnexal mass or tenderness. EXTREMITIES: No cyanosis, clubbing, or edema, 2+ distal pulses.    Assessment:    Healthy female exam.  Plan:    Pap smear collected Patient referred to [facility] program for screening mammogram and [facility]  Patient will be contacted with abnormal results RTC prn See After Visit Summary for Counseling Recommendations

## 2020-11-17 NOTE — Progress Notes (Signed)
NEW GYN, c/o lower abdominal pain 6/10 x 3+ Months.  Last PAP 11/06/2018  Mammogram was ordered 07/18/2020 but patient is unable to get it done because she has no Insurance.

## 2020-11-17 NOTE — Addendum Note (Signed)
Addended by: Courtney Heys on: 11/17/2020 11:48 AM   Modules accepted: Orders

## 2020-11-21 LAB — CYTOLOGY - PAP
Comment: NEGATIVE
Diagnosis: NEGATIVE
High risk HPV: NEGATIVE

## 2020-11-22 ENCOUNTER — Ambulatory Visit: Payer: No Typology Code available for payment source | Admitting: Internal Medicine

## 2020-11-23 ENCOUNTER — Telehealth: Payer: Self-pay

## 2020-11-23 NOTE — Telephone Encounter (Signed)
Telephoned patient at mobile number. Left a message with [facility] contact information. 

## 2020-11-24 ENCOUNTER — Ambulatory Visit (HOSPITAL_BASED_OUTPATIENT_CLINIC_OR_DEPARTMENT_OTHER): Payer: No Typology Code available for payment source | Attending: Internal Medicine | Admitting: Radiology

## 2021-01-10 ENCOUNTER — Telehealth: Payer: Self-pay

## 2021-01-10 NOTE — Telephone Encounter (Signed)
Patient called stating she received letter regarding her pap and needed to schedule appointment to go over results. Patient has not been see in our office since 2020. I told her to call [facility] where she had pap performed to see is she is to be scheduled there. I put her on schedule for 01/12/21. She will call back to cancel is she is to be seen at other doctor office-[facility]

## 2021-01-12 ENCOUNTER — Ambulatory Visit: Payer: BLUE CROSS/BLUE SHIELD | Admitting: Internal Medicine

## 2021-02-08 ENCOUNTER — Ambulatory Visit: Payer: No Typology Code available for payment source | Admitting: Dermatology

## 2021-02-27 ENCOUNTER — Ambulatory Visit: Payer: No Typology Code available for payment source | Admitting: Internal Medicine

## 2021-02-27 ENCOUNTER — Other Ambulatory Visit: Payer: Self-pay

## 2021-02-27 ENCOUNTER — Encounter: Payer: Self-pay | Admitting: Internal Medicine

## 2021-02-27 VITALS — BP 116/80 | HR 82 | Temp 98.6°F | Resp 16 | Ht 64.0 in | Wt 145.4 lb

## 2021-02-27 VITALS — BP 116/80 | HR 88 | Temp 98.7°F | Resp 16 | Ht 64.0 in

## 2021-02-27 DIAGNOSIS — E2839 Other primary ovarian failure: Secondary | ICD-10-CM

## 2021-02-27 DIAGNOSIS — K219 Gastro-esophageal reflux disease without esophagitis: Secondary | ICD-10-CM

## 2021-02-27 DIAGNOSIS — M2559 Pain in other specified joint: Secondary | ICD-10-CM

## 2021-02-27 DIAGNOSIS — F409 Phobic anxiety disorder, unspecified: Secondary | ICD-10-CM

## 2021-02-27 DIAGNOSIS — E782 Mixed hyperlipidemia: Secondary | ICD-10-CM | POA: Diagnosis not present

## 2021-02-27 DIAGNOSIS — F5105 Insomnia due to other mental disorder: Secondary | ICD-10-CM

## 2021-02-27 MED ORDER — TRAZODONE HCL 50 MG PO TABS: 50.0000 mg | ORAL_TABLET | Freq: Every day | ORAL | 3 refills | Status: AC

## 2021-02-27 MED ORDER — FAMOTIDINE 40 MG PO TABS: 40.0000 mg | ORAL_TABLET | Freq: Every day | ORAL | 1 refills | Status: AC

## 2021-02-27 MED ORDER — ROSUVASTATIN CALCIUM 10 MG PO TABS: 10.0000 mg | ORAL_TABLET | Freq: Every day | ORAL | 3 refills | Status: AC

## 2021-02-27 NOTE — Progress Notes (Signed)
[facility] [place], [place] [address]  Internal MEDICINE  Office Visit Note  Patient Name: Jean Gilbert  [id number]  [id number]  Date of Service: 02/27/2021   Complaints/HPI Pt is here for establishment of PCP. Chief Complaint  Patient presents with   Hyperlipidemia    HPI  Pt is seen for reestablishment of PCP  after 2 years  C/o having heartburn, has been under stress due to marital issues she does not take her famotidine on a regular basis. Her recent lipid profile is abnormal with elevated total cholesterol of around 300 and triglycerides of 800. She is also complaining of having menopausal symptoms of jitteriness however hot flashes are resolved.complains of insomnia as well, has arthralgias complains of leg pain at times leg pain wrist and hand pain as well Denies any other problems today no fever no chills no chest pain or shortness of breath Current Medication: Outpatient Encounter Medications as of 02/27/2021  Medication Sig   famotidine (PEPCID) 40 MG tablet Take 1 tablet (40 mg total) by mouth daily.   rosuvastatin (CRESTOR) 10 MG tablet Take 1 tablet (10 mg total) by mouth daily.   traZODone (DESYREL) 50 MG tablet Take 1 tablet (50 mg total) by mouth at bedtime.   Calcium Carbonate (CALCIUM 500 PO) Take by mouth.   Cholecalciferol (VITAMIN D3) 1000 units CAPS Take by mouth every other day.   ibuprofen (ADVIL,MOTRIN) 800 MG tablet Take 1 tablet (800 mg total) by mouth every 8 (eight) hours as needed.   [DISCONTINUED] Tdap (BOOSTRIX) 5-2.5-18.5 LF-MCG/0.5 injection Inject 0.5 mLs into the muscle once.   [DISCONTINUED] Zoster Vaccine Adjuvanted [facility]) injection Inject 0.5 mLs into the muscle once.   No facility-administered encounter medications on file as of 02/27/2021.    Surgical History: Past Surgical History:  Procedure Laterality Date   BREAST BIOPSY Right 2012   benign   BREAST SURGERY Right 1.10.2012    fibroadenoma   CESAREAN SECTION  2000   COLONOSCOPY     In her 20's in [place]   NOSE SURGERY  2004    Medical History: Past Medical History:  Diagnosis Date   Anemia    Arthritis 2013   left shoulder   Breast complaint 2011   Fibroid    Lump or mass in breast 2012   Menopause    Special screening for malignant neoplasms, colon    Vaginal Pap smear, abnormal     Family History: Family History  Problem Relation Age of Onset   Cancer Maternal Uncle        kidney and prostate cancer   Breast cancer Maternal Aunt    Heart disease Mother    Diabetes Mother    Hyperlipidemia Mother    Hypertension Mother    Asthma Maternal Grandmother     Social History   Socioeconomic History   Marital status: Legally Separated    Spouse name: Not on file   Number of children: 2   Years of education: Not on file   Highest education level: Not on file  Occupational History   Not on file  Tobacco Use   Smoking status: Never   Smokeless tobacco: Never  Vaping Use   Vaping Use: Never used  Substance and Sexual Activity   Alcohol use: No   Drug use: No   Sexual activity: Not Currently  Other Topics Concern   Not on file  Social History Narrative   Not on file   Social Determinants of  Health   Financial Resource Strain: Not on file  Food Insecurity: Not on file  Transportation Needs: Not on file  Physical Activity: Not on file  Stress: Not on file  Social Connections: Not on file  Intimate Partner Violence: Not on file     Review of Systems  Constitutional:  Negative for chills, diaphoresis and fatigue.  HENT:  Negative for ear pain, postnasal drip and sinus pressure.   Eyes:  Negative for photophobia, discharge, redness, itching and visual disturbance.  Respiratory:  Negative for cough, shortness of breath and wheezing.   Cardiovascular:  Negative for chest pain, palpitations and leg swelling.  Gastrointestinal:  Negative for abdominal pain, constipation, diarrhea,  nausea and vomiting.  Genitourinary:  Negative for dysuria and flank pain.  Musculoskeletal:  Negative for arthralgias, back pain, gait problem and neck pain.  Skin:  Negative for color change.  Allergic/Immunologic: Negative for environmental allergies and food allergies.  Neurological:  Negative for dizziness and headaches.  Hematological:  Does not bruise/bleed easily.  Psychiatric/Behavioral:  Negative for agitation, behavioral problems (depression) and hallucinations.    Vital Signs: BP 116/80   Pulse 88   Temp 98.7 F (37.1 C)   Resp 16   Ht '5\' 4"'$  (1.626 m)   SpO2 95%   BMI 24.96 kg/m    Physical Exam Constitutional:      Appearance: Normal appearance.  HENT:     Head: Normocephalic and atraumatic.     Right Ear: Tympanic membrane normal.     Left Ear: Tympanic membrane normal.     Nose: Nose normal.     Mouth/Throat:     Mouth: Mucous membranes are moist.     Pharynx: No posterior oropharyngeal erythema.  Eyes:     Extraocular Movements: Extraocular movements intact.     Pupils: Pupils are equal, round, and reactive to light.  Cardiovascular:     Pulses: Normal pulses.     Heart sounds: Normal heart sounds.  Pulmonary:     Effort: Pulmonary effort is normal.     Breath sounds: Normal breath sounds.  Neurological:     General: No focal deficit present.     Mental Status: She is alert.  Psychiatric:        Mood and Affect: Mood normal.        Behavior: Behavior normal.      Assessment/Plan: 1. Mixed hyperlipidemia Patient admits not having her labs drawn fasting her triglycerides are extremely elevated more than 800 we will continue on Crestor 10 mg once a day Festus Holts will need to have fasting labs drawn in 3 months - rosuvastatin (CRESTOR) 10 MG tablet; Take 1 tablet (10 mg total) by mouth daily.  Dispense: 90 tablet; Refill: 3 - Lipid Panel With LDL/HDL Ratio  2. GERD without esophagitis Continue famotidine 40 mg once a day patient is to be regular taking  these medications if no improvement seen will need an upper GI - famotidine (PEPCID) 40 MG tablet; Take 1 tablet (40 mg total) by mouth daily.  Dispense: 90 tablet; Refill: 1  3.  menopause ovarian failure Symptoms of menopause so we will address on next visit she is not interested in any HRT  4. Pain in other joint Multiple arthralgias symptoms joint aches and pains patient is instructed to use over-the-counter Voltaren gel for now  5. Insomnia due to anxiety and fear Continue trazodone as before - traZODone (DESYREL) 50 MG tablet; Take 1 tablet (50 mg total) by mouth at bedtime.  Dispense: 90 tablet; Refill: 3   General Counseling: Keishia verbalizes understanding of the findings of todays visit and agrees with plan of treatment. I have discussed any further diagnostic evaluation that may be needed or ordered today. We also reviewed her medications today. she has been encouraged to call the office with any questions or concerns that should arise related to todays visit.    Counseling:  [place] Controlled Substance Database was reviewed by me.  Orders Placed This Encounter  Procedures   Lipid Panel With LDL/HDL Ratio     Meds ordered this encounter  Medications   rosuvastatin (CRESTOR) 10 MG tablet    Sig: Take 1 tablet (10 mg total) by mouth daily.    Dispense:  90 tablet    Refill:  3   traZODone (DESYREL) 50 MG tablet    Sig: Take 1 tablet (50 mg total) by mouth at bedtime.    Dispense:  90 tablet    Refill:  3   famotidine (PEPCID) 40 MG tablet    Sig: Take 1 tablet (40 mg total) by mouth daily.    Dispense:  90 tablet    Refill:  1    Time spent:35 Minutes

## 2021-06-19 ENCOUNTER — Other Ambulatory Visit: Payer: No Typology Code available for payment source | Admitting: Internal Medicine

## 2021-06-21 ENCOUNTER — Other Ambulatory Visit: Payer: Self-pay

## 2021-06-21 ENCOUNTER — Ambulatory Visit (INDEPENDENT_AMBULATORY_CARE_PROVIDER_SITE_OTHER): Payer: Self-pay | Admitting: Dermatology

## 2021-06-21 DIAGNOSIS — L988 Other specified disorders of the skin and subcutaneous tissue: Secondary | ICD-10-CM

## 2021-06-21 NOTE — Progress Notes (Signed)
° °  Follow-Up Visit   Subjective  Jean Gilbert is a 55 y.o. female who presents for the following: Facial Elastosis (Face, pt presents for botox).  Patient accompanied by friend   The following portions of the chart were reviewed this encounter and updated as appropriate:   Tobacco   Allergies   Meds   Problems   Med Hx   Surg Hx   Fam Hx      Review of Systems:  No other skin or systemic complaints except as noted in HPI or Assessment and Plan.  Objective  Well appearing patient in no apparent distress; mood and affect are within normal limits.  A focused examination was performed including face. Relevant physical exam findings are noted in the Assessment and Plan.  face Rhytides and volume loss.              Assessment & Plan  Elastosis of skin face  Botox 25 units injected today as marked: - Frown complex 25 units  Filling material injection - face Location: Frown complex  Informed consent: Discussed risks (infection, pain, bleeding, bruising, swelling, allergic reaction, paralysis of nearby muscles, eyelid droop, double vision, neck weakness, difficulty breathing, headache, undesirable cosmetic result, and need for additional treatment) and benefits of the procedure, as well as the alternatives.  Informed consent was obtained.  Preparation: The area was cleansed with alcohol.  Procedure Details:  Botox was injected into the dermis with a 30-gauge needle. Pressure applied to any bleeding. Ice packs offered for swelling.  Lot Number:  [id number] Expiration:  05/25  Total Units Injected:  25  Plan: Patient was instructed to remain upright for 4 hours. Patient was instructed to avoid massaging the face and avoid vigorous exercise for the rest of the day. Tylenol may be used for headache.  Allow 2 weeks before returning to clinic for additional dosing as needed. Patient will call for any problems.    Return in about 4 weeks (around 07/19/2021) for botox  f/u.  I, Othelia Pulling, RMA, am acting as scribe for Sarina Ser, MD . Documentation: I have reviewed the above documentation for accuracy and completeness, and I agree with the above.  Sarina Ser, MD

## 2021-06-21 NOTE — Patient Instructions (Signed)

## 2021-06-26 ENCOUNTER — Encounter: Payer: Self-pay | Admitting: Dermatology

## 2021-07-26 ENCOUNTER — Ambulatory Visit: Payer: Self-pay | Admitting: Dermatology

## 2021-08-07 MED ORDER — ZOSTER VAC RECOMB ADJUVANTED 50 MCG/0.5ML IM SUSR: 0.5000 mL | Freq: Once | INTRAMUSCULAR | 0 refills | Status: AC

## 2021-08-07 MED ORDER — TETANUS-DIPHTH-ACELL PERTUSSIS 5-2.5-18.5 LF-MCG/0.5 IM SUSP: 0.5000 mL | Freq: Once | INTRAMUSCULAR | 0 refills | Status: AC

## 2021-08-17 ENCOUNTER — Ambulatory Visit: Payer: 59 | Admitting: Physician Assistant

## 2021-08-22 ENCOUNTER — Encounter: Payer: Self-pay | Admitting: Internal Medicine

## 2021-08-22 ENCOUNTER — Other Ambulatory Visit: Payer: Self-pay

## 2021-08-22 ENCOUNTER — Ambulatory Visit (INDEPENDENT_AMBULATORY_CARE_PROVIDER_SITE_OTHER): Payer: 59 | Admitting: Internal Medicine

## 2021-08-22 VITALS — BP 122/80 | HR 53 | Temp 98.4°F | Resp 16 | Ht 64.0 in | Wt 142.0 lb

## 2021-08-22 DIAGNOSIS — R1030 Lower abdominal pain, unspecified: Secondary | ICD-10-CM

## 2021-08-22 DIAGNOSIS — R300 Dysuria: Secondary | ICD-10-CM | POA: Diagnosis not present

## 2021-08-22 DIAGNOSIS — R3129 Other microscopic hematuria: Secondary | ICD-10-CM

## 2021-08-22 LAB — POCT URINALYSIS DIPSTICK
Bilirubin, UA: NEGATIVE
Glucose, UA: NEGATIVE
Nitrite, UA: NEGATIVE
Protein, UA: POSITIVE — AB
Spec Grav, UA: 1.025 (ref 1.010–1.025)
Urobilinogen, UA: 0.2 E.U./dL
pH, UA: 5 (ref 5.0–8.0)

## 2021-08-22 NOTE — Progress Notes (Signed)
[facility] [place], [place] [address]  Internal MEDICINE  Office Visit Note  Patient Name: Jean Gilbert  [id number]  [id number]  Date of Service: 09/05/2021  Chief Complaint  Patient presents with   cyst on liver   Hematuria    2 weeks ago from another provider at walk in clinic in [place] ([place], Trish Mage, MD)      HPI Pt is here for routine follow up  She had an episode of back pain. Went to urgent care and was diagnosed to have lower back pain. Was given steroid dose pack, does feel better  CT scan showed incidental finding of hepatic cyst and she is concerned about it.  She was also told that she has blood in her urine I am unable to access those labs or CT scan she was instructed to have a follow-up with her PCP. I am making a note that patient is seen multiple providers which can create conflict She denies any chest pain shortness of breath at the moment  Current Medication: Outpatient Encounter Medications as of 08/22/2021  Medication Sig   Calcium Carbonate (CALCIUM 500 PO) Take by mouth.   Cholecalciferol (VITAMIN D3) 1000 units CAPS Take by mouth every other day.   famotidine (PEPCID) 40 MG tablet Take 1 tablet (40 mg total) by mouth daily.   ibuprofen (ADVIL,MOTRIN) 800 MG tablet Take 1 tablet (800 mg total) by mouth every 8 (eight) hours as needed.   rosuvastatin (CRESTOR) 10 MG tablet Take 1 tablet (10 mg total) by mouth daily.   traZODone (DESYREL) 50 MG tablet Take 1 tablet (50 mg total) by mouth at bedtime.   No facility-administered encounter medications on file as of 08/22/2021.    Surgical History: Past Surgical History:  Procedure Laterality Date   BREAST BIOPSY Right 2012   benign   BREAST SURGERY Right 1.10.2012   fibroadenoma   CESAREAN SECTION  2000   COLONOSCOPY     In her 20's in [place]   NOSE SURGERY  2004    Medical History: Past Medical History:  Diagnosis Date   Anemia    Arthritis 2013    left shoulder   Breast complaint 2011   Fibroid    Lump or mass in breast 2012   Menopause    Special screening for malignant neoplasms, colon    Vaginal Pap smear, abnormal     Family History: Family History  Problem Relation Age of Onset   Cancer Maternal Uncle        kidney and prostate cancer   Breast cancer Maternal Aunt    Heart disease Mother    Diabetes Mother    Hyperlipidemia Mother    Hypertension Mother    Asthma Maternal Grandmother     Social History   Socioeconomic History   Marital status: Legally Separated    Spouse name: Not on file   Number of children: 2   Years of education: Not on file   Highest education level: Not on file  Occupational History   Not on file  Tobacco Use   Smoking status: Never   Smokeless tobacco: Never  Vaping Use   Vaping Use: Never used  Substance and Sexual Activity   Alcohol use: No   Drug use: No   Sexual activity: Not Currently  Other Topics Concern   Not on file  Social History Narrative   Not on file   Social Determinants of Health   Financial Resource Strain:  Not on file  Food Insecurity: Not on file  Transportation Needs: Not on file  Physical Activity: Not on file  Stress: Not on file  Social Connections: Not on file  Intimate Partner Violence: Not on file      Review of Systems  Constitutional:  Negative for fatigue and fever.  HENT:  Negative for congestion, mouth sores and postnasal drip.   Respiratory:  Negative for cough.   Cardiovascular:  Negative for chest pain.  Genitourinary:  Negative for flank pain.  Psychiatric/Behavioral: Negative.     Vital Signs: BP 122/80    Pulse (!) 53    Temp 98.4 F (36.9 C)    Resp 16    Ht 5\' 4"  (1.626 m)    Wt 142 lb (64.4 kg)    SpO2 97%    BMI 24.37 kg/m    Physical Exam Constitutional:      Appearance: Normal appearance.  HENT:     Head: Normocephalic and atraumatic.     Nose: Nose normal.     Mouth/Throat:     Mouth: Mucous membranes are  moist.     Pharynx: No posterior oropharyngeal erythema.  Eyes:     Extraocular Movements: Extraocular movements intact.     Pupils: Pupils are equal, round, and reactive to light.  Cardiovascular:     Pulses: Normal pulses.     Heart sounds: Normal heart sounds.  Pulmonary:     Effort: Pulmonary effort is normal.     Breath sounds: Normal breath sounds.  Neurological:     General: No focal deficit present.     Mental Status: She is alert.  Psychiatric:        Mood and Affect: Mood normal.        Behavior: Behavior normal.       Assessment/Plan: 1. Dysuria We will order urine for cultures and sensitivities - POCT Urinalysis Dipstick - CULTURE, URINE COMPREHENSIVE - US Abdomen Complete; Future - US PELVIS (TRANSABDOMINAL ONLY); Future  2. Other microscopic hematuria Needs bladder and renal ultrasound - US Abdomen Complete; Future - US PELVIS (TRANSABDOMINAL ONLY); Future  3. Lower abdominal pain Patient also has hepatic cyst we will do a follow-up ultrasound - US Abdomen Complete; Future - US PELVIS (TRANSABDOMINAL ONLY); Future   General Counseling: darcelle herrada understanding of the findings of todays visit and agrees with plan of treatment. I have discussed any further diagnostic evaluation that may be needed or ordered today. We also reviewed her medications today. she has been encouraged to call the office with any questions or concerns that should arise related to todays visit. Patient understands and agrees with the treatment plan   Orders Placed This Encounter  Procedures   CULTURE, URINE COMPREHENSIVE   US Abdomen Complete   US PELVIS (TRANSABDOMINAL ONLY)   POCT Urinalysis Dipstick    No orders of the defined types were placed in this encounter.   Total time spent:20 Minutes Time spent includes review of chart, medications, test results, and follow up plan with the patient.   [place] Controlled Substance Database was reviewed by me.   Dr Lavera Guise Internal medicine

## 2021-08-27 LAB — CULTURE, URINE COMPREHENSIVE

## 2021-09-07 ENCOUNTER — Other Ambulatory Visit: Payer: Self-pay | Admitting: Adult Medicine

## 2021-09-07 DIAGNOSIS — Z1231 Encounter for screening mammogram for malignant neoplasm of breast: Secondary | ICD-10-CM

## 2021-09-22 ENCOUNTER — Telehealth: Payer: Self-pay

## 2021-09-22 NOTE — Telephone Encounter (Signed)
Completed medical records for [facility], faxed to [phone], case number [id number] ?

## 2021-09-27 ENCOUNTER — Other Ambulatory Visit: Payer: Self-pay

## 2021-09-27 ENCOUNTER — Other Ambulatory Visit: Payer: 59

## 2021-10-10 ENCOUNTER — Ambulatory Visit: Payer: 59 | Admitting: Internal Medicine

## 2021-11-22 ENCOUNTER — Ambulatory Visit: Payer: Self-pay

## 2021-11-22 ENCOUNTER — Ambulatory Visit: Payer: 59

## 2021-11-30 ENCOUNTER — Telehealth: Payer: Self-pay

## 2021-11-30 ENCOUNTER — Ambulatory Visit: Payer: 59 | Admitting: Nurse Practitioner

## 2021-11-30 NOTE — Telephone Encounter (Signed)
Patient discharged from practice. Mailed discharge letter to patient. Copy to scan-Toni

## 2022-06-16 ENCOUNTER — Other Ambulatory Visit: Payer: Self-pay

## 2022-06-16 ENCOUNTER — Emergency Department
Admission: EM | Admit: 2022-06-16 | Discharge: 2022-06-16 | Disposition: A | Discharge status: Home / Self Care | Payer: Commercial Managed Care - HMO | Attending: Emergency Medicine | Admitting: Emergency Medicine

## 2022-06-16 ENCOUNTER — Emergency Department: Payer: Commercial Managed Care - HMO

## 2022-06-16 DIAGNOSIS — M79642 Pain in left hand: Secondary | ICD-10-CM | POA: Insufficient documentation

## 2022-06-16 DIAGNOSIS — S20219A Contusion of unspecified front wall of thorax, initial encounter: Secondary | ICD-10-CM | POA: Diagnosis not present

## 2022-06-16 DIAGNOSIS — R0789 Other chest pain: Secondary | ICD-10-CM | POA: Diagnosis present

## 2022-06-16 DIAGNOSIS — Y92410 Unspecified street and highway as the place of occurrence of the external cause: Secondary | ICD-10-CM | POA: Insufficient documentation
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
## 2023-10-03 ENCOUNTER — Other Ambulatory Visit: Payer: Self-pay | Admitting: Internal Medicine

## 2023-10-03 DIAGNOSIS — Z1231 Encounter for screening mammogram for malignant neoplasm of breast: Secondary | ICD-10-CM

## 2023-10-10 NOTE — Progress Notes (Signed)
error 

## 2023-10-15 ENCOUNTER — Ambulatory Visit

## 2023-11-26 ENCOUNTER — Telehealth: Payer: Self-pay | Admitting: Internal Medicine

## 2023-11-26 NOTE — Telephone Encounter (Signed)
 Received MR request from [facility]. Faxed to [phone]

## 2024-01-07 ENCOUNTER — Ambulatory Visit: Payer: Self-pay | Admitting: Dermatology

## 2024-02-06 ENCOUNTER — Encounter: Payer: Self-pay | Admitting: Dermatology

## 2024-02-06 ENCOUNTER — Ambulatory Visit (INDEPENDENT_AMBULATORY_CARE_PROVIDER_SITE_OTHER): Payer: Self-pay | Admitting: Dermatology

## 2024-02-06 DIAGNOSIS — L988 Other specified disorders of the skin and subcutaneous tissue: Secondary | ICD-10-CM

## 2024-02-06 NOTE — Progress Notes (Signed)
   Follow-Up Visit   Subjective  Jean Gilbert is a 58 y.o. female who presents for the following: Botox for facial elastosis  The following portions of the chart were reviewed this encounter and updated as appropriate: medications, allergies, medical history  Review of Systems:  No other skin or systemic complaints except as noted in HPI or Assessment and Plan.  Objective  Well appearing patient in no apparent distress; mood and affect are within normal limits.  A focused examination was performed of the face.  Relevant physical exam findings are noted in the Assessment and Plan.      Assessment & Plan   Facial Elastosis  Location: See attached image  Informed consent: Discussed risks (infection, pain, bleeding, bruising, swelling, allergic reaction, paralysis of nearby muscles, eyelid droop, double vision, neck weakness, difficulty breathing, headache, undesirable cosmetic result, and need for additional treatment) and benefits of the procedure, as well as the alternatives.  Informed consent was obtained.  Preparation: The area was cleansed with alcohol.  Procedure Details:  Botox was injected into the dermis with a 30-gauge needle. Pressure applied to any bleeding. Ice packs offered for swelling.  Lot Number:  [id number] Expiration:  11/2025  Total Units Injected:  25  Plan: Tylenol may be used for headache.  Allow 2 weeks before returning to clinic for additional dosing as needed. Patient will call for any problems.  Return if symptoms worsen or fail to improve.  LILLETTE Lonell Drones, RMA, am acting as scribe for Alm Rhyme, MD .   Documentation: I have reviewed the above documentation for accuracy and completeness, and I agree with the above.  Alm Rhyme, MD

## 2024-02-06 NOTE — Patient Instructions (Signed)
